# Patient Record
Sex: Female | Born: 1960 | Race: White | Hispanic: No | Marital: Married | Smoking: Never smoker
Health system: Southern US, Community
[De-identification: ages and names within clinical notes are randomized; demographics above are authoritative.]

## PROBLEM LIST (undated history)

## (undated) DIAGNOSIS — E042 Nontoxic multinodular goiter: Secondary | ICD-10-CM

## (undated) DIAGNOSIS — R9389 Abnormal findings on diagnostic imaging of other specified body structures: Secondary | ICD-10-CM

## (undated) DIAGNOSIS — Z8489 Family history of other specified conditions: Secondary | ICD-10-CM

## (undated) DIAGNOSIS — I10 Essential (primary) hypertension: Secondary | ICD-10-CM

## (undated) HISTORY — DX: Essential (primary) hypertension: I10

## (undated) HISTORY — PX: WISDOM TOOTH EXTRACTION: SHX21

## (undated) HISTORY — PX: TONSILLECTOMY AND ADENOIDECTOMY: SUR1326

## (undated) HISTORY — PX: TOTAL ABDOMINAL HYSTERECTOMY: SHX209

---

## 2007-04-29 ENCOUNTER — Other Ambulatory Visit: Admission: RE | Admit: 2007-04-29 | Discharge: 2007-04-29 | Payer: Self-pay | Admitting: Obstetrics and Gynecology

## 2008-04-30 ENCOUNTER — Other Ambulatory Visit: Admission: RE | Admit: 2008-04-30 | Discharge: 2008-04-30 | Payer: Self-pay | Admitting: Obstetrics and Gynecology

## 2013-03-21 ENCOUNTER — Telehealth: Payer: Self-pay | Admitting: Certified Nurse Midwife

## 2013-03-21 NOTE — Telephone Encounter (Signed)
Medication question from patient about missed birth control pills resulting in her cycle starting. Patient went out of town unexpectedly and forgot her birth control pills. She wants to know when to start her new pack please?

## 2013-03-21 NOTE — Telephone Encounter (Signed)
Patient currently on Trivora. Forgot 5 pills and started a period. Advised to start a new pack this Sunday and use BUM for two weeks. Patient agreeable.  Routing to provider for review, Dr. Farrel Gobble do you agree?

## 2013-05-22 HISTORY — PX: COLONOSCOPY WITH PROPOFOL: SHX5780

## 2013-07-15 ENCOUNTER — Telehealth: Payer: Self-pay | Admitting: Certified Nurse Midwife

## 2013-07-15 ENCOUNTER — Encounter: Payer: Self-pay | Admitting: Certified Nurse Midwife

## 2013-07-15 MED ORDER — LEVONORG-ETH ESTRAD TRIPHASIC PO TABS: 1.0000 | ORAL_TABLET | Freq: Every day | ORAL | Status: DC

## 2013-07-15 NOTE — Telephone Encounter (Signed)
Patient requesting a refill for Trivora. AEX 08/18/13 with [name], CNM.  [facility] [place]

## 2013-07-15 NOTE — Telephone Encounter (Signed)
Last AEX 07/17/12 Last refill 07/17/12 x 1 year Next appt 08/18/13  Will refill until appt.  - Patient notified.

## 2013-07-16 ENCOUNTER — Ambulatory Visit: Payer: Self-pay | Admitting: Certified Nurse Midwife

## 2013-07-17 ENCOUNTER — Ambulatory Visit: Payer: Self-pay | Admitting: Certified Nurse Midwife

## 2013-07-29 ENCOUNTER — Ambulatory Visit: Payer: Self-pay | Admitting: Certified Nurse Midwife

## 2013-08-10 ENCOUNTER — Other Ambulatory Visit: Payer: Self-pay | Admitting: Certified Nurse Midwife

## 2013-08-11 NOTE — Telephone Encounter (Signed)
Patient has AEX 08/18/13 with DL//kn

## 2013-08-18 ENCOUNTER — Ambulatory Visit: Payer: Self-pay | Admitting: Certified Nurse Midwife

## 2013-09-05 ENCOUNTER — Other Ambulatory Visit: Payer: Self-pay | Admitting: Certified Nurse Midwife

## 2013-09-08 ENCOUNTER — Ambulatory Visit: Payer: BC Managed Care – PPO | Admitting: Certified Nurse Midwife

## 2013-09-08 ENCOUNTER — Ambulatory Visit (INDEPENDENT_AMBULATORY_CARE_PROVIDER_SITE_OTHER): Payer: BC Managed Care – PPO | Admitting: Certified Nurse Midwife

## 2013-09-08 ENCOUNTER — Encounter: Payer: Self-pay | Admitting: Certified Nurse Midwife

## 2013-09-08 ENCOUNTER — Ambulatory Visit: Payer: Self-pay | Admitting: Certified Nurse Midwife

## 2013-09-08 VITALS — BP 102/62 | HR 64 | Resp 16 | Ht 61.75 in | Wt 127.0 lb

## 2013-09-08 DIAGNOSIS — Z309 Encounter for contraceptive management, unspecified: Secondary | ICD-10-CM

## 2013-09-08 DIAGNOSIS — Z01419 Encounter for gynecological examination (general) (routine) without abnormal findings: Secondary | ICD-10-CM

## 2013-09-08 DIAGNOSIS — Z0000 Encounter for general adult medical examination without abnormal findings: Secondary | ICD-10-CM

## 2013-09-08 LAB — POCT URINALYSIS DIPSTICK
BILIRUBIN UA: NEGATIVE
Blood, UA: NEGATIVE
GLUCOSE UA: NEGATIVE
KETONES UA: NEGATIVE
LEUKOCYTES UA: NEGATIVE
NITRITE UA: NEGATIVE
PH UA: 5
Protein, UA: NEGATIVE
Urobilinogen, UA: NEGATIVE

## 2013-09-08 LAB — HEMOGLOBIN, FINGERSTICK: HEMOGLOBIN, FINGERSTICK: 14.5 g/dL (ref 12.0–16.0)

## 2013-09-08 MED ORDER — NORETHIN ACE-ETH ESTRAD-FE 1-20 MG-MCG PO TABS: 1.0000 | ORAL_TABLET | Freq: Every day | ORAL | Status: DC

## 2013-09-08 NOTE — Progress Notes (Signed)
53 y.o. G23P2002 Married Caucasian Fe here for annual exam. Periods normal, no issues.Contraception working well. Desires continuance. Spouse lost job due to company being bought out, but has a new opportunity. Stress at home due to this and sister in hospital with questionable heart issue.  "Doing the best we can right now". Sleeping well, no issues. See PCP prn. No other health problems today.  [name] was 09/01/2013.          Sexually active: yes  The current method of family planning is OCP (estrogen/progesterone).    Exercising: yes  walking & yardwork Smoker:  no  Health Maintenance: Pap:  07-17-12 neg HPV HR neg MMG:  1/14 normal Colonoscopy: none BMD:   none TDaP:  2011 Labs: Poct urine-neg, Hgb- 14.5 Self breast exam: done occ   reports that she has never smoked. She does not have any smokeless tobacco history on file. She reports that she drinks about one ounce of alcohol per week. She reports that she does not use illicit drugs.  History reviewed. No pertinent past medical history.  Past Surgical History  Procedure Laterality Date  . Mole removal  1/15    4 removed-negative    Current Outpatient Prescriptions  Medication Sig Dispense Refill  . TRIVORA, 28, tablet TAKE 1 TABLET BY MOUTH EVERY DAY  28 tablet  0   No current facility-administered medications for this visit.    Family History  Problem Relation Age of Onset  . Hypertension Mother   . Cancer Sister     hodgekins  . Lung disease Father     ROS:  Pertinent items are noted in HPI.  Otherwise, a comprehensive ROS was negative.  Exam:   BP 102/62  Pulse 64  Resp 16  Ht 5' 1.75" (1.568 m)  Wt 127 lb (57.607 kg)  BMI 23.43 kg/m2  LMP 09/01/2013 Height: 5' 1.75" (156.8 cm)  Ht Readings from Last 3 Encounters:  09/08/13 5' 1.75" (1.568 m)    General appearance: alert, cooperative and appears stated age Head: Normocephalic, without obvious abnormality, atraumatic Neck: no  adenopathy, supple, symmetrical, trachea midline and thyroid normal to inspection and palpation and non-palpable Lungs: clear to auscultation bilaterally Breasts: normal appearance, no masses or tenderness, No nipple retraction or dimpling, No nipple discharge or bleeding, No axillary or supraclavicular adenopathy Heart: regular rate and rhythm Abdomen: soft, non-tender; no masses,  no organomegaly Extremities: extremities normal, atraumatic, no cyanosis or edema Skin: Skin color, texture, turgor normal. No rashes or lesions Lymph nodes: Cervical, supraclavicular, and axillary nodes normal. No abnormal inguinal nodes palpated Neurologic: Grossly normal   Pelvic: External genitalia:  no lesions              Urethra:  normal appearing urethra with no masses, tenderness or lesions              Bartholin's and Skene's: normal                 Vagina: normal appearing vagina with normal color and discharge, no lesions              Cervix: normal, non tender              Pap taken: no Bimanual Exam:  Uterus:  normal size, contour, position, consistency, mobility, non-tender and mid postion              Adnexa: normal adnexa and no mass, fullness, tenderness  Rectovaginal: Confirms               Anus:  normal sphincter tone, no lesions  A:  Well Woman with normal exam  Contraception works well  Social stress with spouse employment  P:   Reviewed health and wellness pertinent to exam  Discussed need to lower estrogen dose in OCP due to age. Patient agreeable.  Rx Loestrin 1/20 Fe see order, encouraged to use condoms during first month as decrease in estrogen level. Continue to keep menses record. Advise if changes.  Discussed family and friend support during time of stress, and time for self.  Mammogram stressed due pap smear not taken today  counseled on breast self exam, mammography screening, use and side effects of OCP's, adequate intake of calcium and vitamin D, diet and  exercise  return annually or prn  An After Visit Summary was printed and given to the patient.

## 2013-09-08 NOTE — Patient Instructions (Signed)

## 2013-09-17 NOTE — Progress Notes (Signed)
Reviewed personally.  M. [name] Reichen Hutzler, MD.  

## 2014-03-23 ENCOUNTER — Encounter: Payer: Self-pay | Admitting: Certified Nurse Midwife

## 2014-09-10 ENCOUNTER — Ambulatory Visit (INDEPENDENT_AMBULATORY_CARE_PROVIDER_SITE_OTHER): Payer: BLUE CROSS/BLUE SHIELD | Admitting: Certified Nurse Midwife

## 2014-09-10 ENCOUNTER — Encounter: Payer: Self-pay | Admitting: Certified Nurse Midwife

## 2014-09-10 ENCOUNTER — Telehealth: Payer: Self-pay | Admitting: Certified Nurse Midwife

## 2014-09-10 VITALS — BP 110/70 | HR 68 | Resp 16 | Ht 62.25 in | Wt 131.0 lb

## 2014-09-10 DIAGNOSIS — Z0000 Encounter for general adult medical examination without abnormal findings: Secondary | ICD-10-CM | POA: Diagnosis not present

## 2014-09-10 DIAGNOSIS — E049 Nontoxic goiter, unspecified: Secondary | ICD-10-CM | POA: Diagnosis not present

## 2014-09-10 DIAGNOSIS — N951 Menopausal and female climacteric states: Secondary | ICD-10-CM | POA: Diagnosis not present

## 2014-09-10 DIAGNOSIS — Z01419 Encounter for gynecological examination (general) (routine) without abnormal findings: Secondary | ICD-10-CM

## 2014-09-10 DIAGNOSIS — Z124 Encounter for screening for malignant neoplasm of cervix: Secondary | ICD-10-CM | POA: Diagnosis not present

## 2014-09-10 DIAGNOSIS — Z3049 Encounter for surveillance of other contraceptives: Secondary | ICD-10-CM | POA: Diagnosis not present

## 2014-09-10 LAB — POCT URINALYSIS DIPSTICK
Bilirubin, UA: NEGATIVE
Glucose, UA: NEGATIVE
Ketones, UA: NEGATIVE
LEUKOCYTES UA: NEGATIVE
Nitrite, UA: NEGATIVE
PROTEIN UA: NEGATIVE
RBC UA: NEGATIVE
UROBILINOGEN UA: NEGATIVE
pH, UA: 5

## 2014-09-10 LAB — CBC
HCT: 42.4 % (ref 36.0–46.0)
Hemoglobin: 13.9 g/dL (ref 12.0–15.0)
MCH: 30.9 pg (ref 26.0–34.0)
MCHC: 32.8 g/dL (ref 30.0–36.0)
MCV: 94.2 fL (ref 78.0–100.0)
MPV: 10.1 fL (ref 8.6–12.4)
Platelets: 243 10*3/uL (ref 150–400)
RBC: 4.5 MIL/uL (ref 3.87–5.11)
RDW: 13.8 % (ref 11.5–15.5)
WBC: 6.8 10*3/uL (ref 4.0–10.5)

## 2014-09-10 LAB — COMPREHENSIVE METABOLIC PANEL WITH GFR
ALT: 14 U/L (ref 0–35)
AST: 13 U/L (ref 0–37)
Albumin: 4 g/dL (ref 3.5–5.2)
Alkaline Phosphatase: 59 U/L (ref 39–117)
BUN: 14 mg/dL (ref 6–23)
CO2: 25 meq/L (ref 19–32)
Calcium: 9.1 mg/dL (ref 8.4–10.5)
Chloride: 107 meq/L (ref 96–112)
Creat: 0.73 mg/dL (ref 0.50–1.10)
Glucose, Bld: 88 mg/dL (ref 70–99)
Potassium: 4.8 meq/L (ref 3.5–5.3)
Sodium: 138 meq/L (ref 135–145)
Total Bilirubin: 0.4 mg/dL (ref 0.2–1.2)
Total Protein: 6.4 g/dL (ref 6.0–8.3)

## 2014-09-10 LAB — THYROID PANEL WITH TSH
Free Thyroxine Index: 2.4 (ref 1.4–3.8)
T3 Uptake: 26 % (ref 22–35)
T4, Total: 9.4 ug/dL (ref 4.5–12.0)
TSH: 0.56 u[IU]/mL (ref 0.350–4.500)

## 2014-09-10 LAB — LIPID PANEL
CHOL/HDL RATIO: 2.5 ratio
Cholesterol: 124 mg/dL (ref 0–200)
HDL: 50 mg/dL (ref >=46)
LDL CALC: 41 mg/dL (ref 0–99)
TRIGLYCERIDES: 163 mg/dL — AB (ref <150)
VLDL: 33 mg/dL (ref 0–40)

## 2014-09-10 NOTE — Telephone Encounter (Signed)
Due to enlargement even if results are normal will need [place] to rule out nodules that may be present and not felt.

## 2014-09-10 NOTE — Telephone Encounter (Signed)
Spoke with patient. Patient calling to check to see if she needs to schedule thyroid ultrasound before receiving results from thyroid testing. Advised per OV note Regina Eck CNM recommends evaluation with lab work and [place]. Patient prefers to wait until lab results back to see if [place] is needed. Advised she will be contacted with results and if [place] is recommended at that time will help to facilitate scheduling. Patient is agreeable.  Routing to provider for final review. Patient agreeable to disposition. Will close encounter

## 2014-09-10 NOTE — Progress Notes (Signed)
54 y.o. G77P2002 Married  Caucasian Fe here for annual exam. Periods still regular. Continues to take OCP aware she will need discontinue. Occasional hot flash, no issues. Patient only see Urgent care if needed. No health issues in the last year. No other problems today.  [name] was 08/30/2014.          Sexually active: Yes.    The current method of family planning is OCP (estrogen/progesterone).    Exercising: Yes.    walking Smoker:  no  Health Maintenance: Pap:  07-17-12 neg HPV HR neg MMG:  09-23-13 category c,birads 1:neg, needs 3 D yearly Colonoscopy:  5/15 neg. 10 years BMD:   none TDaP:  2011 Labs: Poct urine-neg,Hgb-13.6 Self breast exam: done occ   reports that she has never smoked. She does not have any smokeless tobacco history on file. She reports that she drinks about 1.2 - 1.8 oz of alcohol per week. She reports that she does not use illicit drugs.  History reviewed. No pertinent past medical history.  Past Surgical History  Procedure Laterality Date  . Mole removal  1/15    4 removed-negative    Current Outpatient Prescriptions  Medication Sig Dispense Refill  . norethindrone-ethinyl estradiol (JUNEL FE,GILDESS FE,LOESTRIN FE) 1-20 MG-MCG tablet Take 1 tablet by mouth daily. 1 Package 12   No current facility-administered medications for this visit.    Family History  Problem Relation Age of Onset  . Hypertension Mother   . Cancer Sister     hodgekins  . Lung disease Father     ROS:  Pertinent items are noted in HPI.  Otherwise, a comprehensive ROS was negative.  Exam:   BP 110/70 mmHg  Pulse 68  Resp 16  Ht 5' 2.25" (1.581 m)  Wt 131 lb (59.421 kg)  BMI 23.77 kg/m2  LMP 08/30/2014 Height: 5' 2.25" (158.1 cm) Ht Readings from Last 3 Encounters:  09/10/14 5' 2.25" (1.581 m)  09/08/13 5' 1.75" (1.568 m)    General appearance: alert, cooperative and appears stated age Head: Normocephalic, without obvious abnormality,  atraumatic Neck: no adenopathy, supple, symmetrical, trachea midline and thyroid enlarged and on right, no nodule palpated Lungs: clear to auscultation bilaterally Breasts: normal appearance, no masses or tenderness, No nipple retraction or dimpling, No nipple discharge or bleeding, No axillary or supraclavicular adenopathy Heart: regular rate and rhythm Abdomen: soft, non-tender; no masses,  no organomegaly Extremities: extremities normal, atraumatic, no cyanosis or edema Skin: Skin color, texture, turgor normal. No rashes or lesions Lymph nodes: Cervical, supraclavicular, and axillary nodes normal. No abnormal inguinal nodes palpated Neurologic: Grossly normal   Pelvic: External genitalia:  no lesions              Urethra:  normal appearing urethra with no masses, tenderness or lesions              Bartholin's and Skene's: normal                 Vagina: normal appearing vagina with normal color and discharge, no lesions              Cervix: normal, non tender, no lesions              Pap taken: Yes.   Bimanual Exam:  Uterus:  normal size, contour, position, consistency, mobility, non-tender              Adnexa: normal adnexa and no mass, fullness, tenderness  Rectovaginal: Confirms               Anus:  normal sphincter tone, no lesions    A:  Well Woman with normal exam  ?perimenopausal  Contraception OCP, aware she will need to discontinue this year  Enlarged thyroid  Screening labs  P:   Reviewed health and wellness pertinent to exam  Discussed etiology of perimenopause and expectations with cycles and symptoms. Patient to come in on day 6 of placebo of OCP to have [place], [place] drawn. Will  Not start back on OCP. Will discuss management when lab in.  Discussed finding of enlarged thyroid and need to evaluate with [place]. Patient agreeable. Will schedule with imaging and patient will be called with appointment. Questions addressed regarding possible etiology and lab work  also needed.  Labs: TSH with panel, lipid panel, CMP,Vit. D, CBC  Pap smear taken today with HPV relflex   counseled on breast self exam, mammography screening, use and side effects of OCP's, menopause, adequate intake of calcium and vitamin D, diet and exercise  return annually or prn  An After Visit Summary was printed and given to the patient.

## 2014-09-10 NOTE — Patient Instructions (Signed)

## 2014-09-10 NOTE — Telephone Encounter (Signed)
Pt left voicemail. Pt stated she saw D.Leonard this morning and would like her nurse to call her back. Pt stated she has some questions.

## 2014-09-10 NOTE — Progress Notes (Signed)
Reviewed personally.  M. [name] Amando Ishikawa, MD.  

## 2014-09-11 LAB — VITAMIN D 25 HYDROXY (VIT D DEFICIENCY, FRACTURES): Vit D, 25-Hydroxy: 44 ng/mL (ref 30–100)

## 2014-09-11 NOTE — Telephone Encounter (Signed)
Left message to call Kaleigha Chamberlin at [phone]. 

## 2014-09-11 NOTE — Telephone Encounter (Addendum)
Spoke with patient. Advised of message as seen below form Regina Eck CNM. Patient is agreeable and would like to schedule [place] at this time. Spoke with Angelita Ingles at [facility]. Thyroid US scheduled for 4/26 at 11am at [place]. Spoke with patient. Patient is agreeable of appointment date and time. Patient requesting results from labs yesterday. Advised will have provider review and return call with further recommendations. Patient is agreeable.  Routing to [employer], FNP as Regina Eck CNM is out of the office today.

## 2014-09-11 NOTE — Telephone Encounter (Signed)
Spoke with patient. Patient calling in regards to lab work results. Advised patient thyroid panel, CMP, Vit D, and CBC are all normal. Advised patient lipid panel is normal minus slight elevation in Triglycerides at 163. Advised to increase exercise and healthy eating to decrease this level. Patient is agreeable. Advised will have provider review results as well and if anything further is needed will return call. Patient is agreeable.

## 2014-09-14 LAB — HEMOGLOBIN, FINGERSTICK: Hemoglobin, fingerstick: 13.6 g/dL (ref 12.0–16.0)

## 2014-09-14 LAB — IPS PAP TEST WITH REFLEX TO HPV

## 2014-09-15 ENCOUNTER — Telehealth: Payer: Self-pay | Admitting: Certified Nurse Midwife

## 2014-09-15 ENCOUNTER — Ambulatory Visit
Admission: RE | Admit: 2014-09-15 | Discharge: 2014-09-15 | Disposition: A | Discharge status: Home / Self Care | Payer: BLUE CROSS/BLUE SHIELD | Source: Ambulatory Visit | Attending: Certified Nurse Midwife | Admitting: Certified Nurse Midwife

## 2014-09-15 DIAGNOSIS — E049 Nontoxic goiter, unspecified: Secondary | ICD-10-CM

## 2014-09-15 NOTE — Telephone Encounter (Signed)
Routing to Regina Eck CNM as Juluis Rainier. Result report not yet available in EPIC.

## 2014-09-15 NOTE — Telephone Encounter (Signed)
Patient calling to let Verline Lema know that she had her thyroid ultrasound today. Patient was told her results would be faxed today. Patient will wait for a call when results are in.

## 2014-09-16 ENCOUNTER — Telehealth: Payer: Self-pay | Admitting: Certified Nurse Midwife

## 2014-09-16 DIAGNOSIS — E049 Nontoxic goiter, unspecified: Secondary | ICD-10-CM

## 2014-09-16 NOTE — Telephone Encounter (Signed)
Patient calling to get her ultrasound results.

## 2014-09-16 NOTE — Telephone Encounter (Signed)
   Result Notes     Notes Recorded by Regina Eck, CNM on 09/16/2014 at 8:17 AM Notify patient that thyroid US showed enlarged right lobe with complex nodule noted Left is enlarged with multiple nodules with solid nodule noted Thyroid biopsy is recommended to evaluate.  Please refer to Endocrine with an appointment soon for evaluation due biopsy recommendation.

## 2014-09-16 NOTE — Telephone Encounter (Signed)
Left message at [facility] to set up new patient appointment with Dr.Balan or first available provider. Requested return call to schedule.

## 2014-09-16 NOTE — Telephone Encounter (Signed)
Spoke with patient. Advised of results and message as seen below from [place]. Patient is agreeable. Advised I have placed a referral to [facility] for further evaluation. Patient is agreeable and would like first availabel appointment with their office. Advised will call to schedule appointment and return call with appointment date and time.

## 2014-09-17 NOTE — Telephone Encounter (Signed)
Left message to call Rocio Roam at [phone]. 

## 2014-09-17 NOTE — Telephone Encounter (Signed)
Spoke with patient. Patient states that she spoke with Dr.Balan's office and the first available appointment they have is in June. "I am worried because this is so far away and I will have to wait that long to have the biopsy." Advised patient will refer her to Dr.Althiemer to see if we can not get her an earlier appointment. Patient is agreeable.

## 2014-09-17 NOTE — Telephone Encounter (Signed)
Patient requests a call back from [name], CNM. She says she has a few questions for her.

## 2014-09-18 NOTE — Telephone Encounter (Signed)
Left message to call [place] at [phone]. Appointment scheduled at [facility] with [place] on May 5th at 2:45pm.

## 2014-09-18 NOTE — Telephone Encounter (Signed)
Spoke with Amy at [place] office referral faxed with demographics, insurance information, OV notes, and thyroid ultrasound results with cover sheet to their office for Dr.Althiemer's review.

## 2014-09-18 NOTE — Telephone Encounter (Signed)
Patient calling to check on the status of the request below. She requests a call back today "no matter what."

## 2014-09-21 NOTE — Telephone Encounter (Signed)
Spoke with patient on 4/29. Advised of appointment date and time as seen below for May 5th at 2:45pm with Dr.Ellison at [facility]. Patient was agreeable to date and time.  Routing to provider for final review. Patient agreeable to disposition. Will close encounter

## 2014-09-25 ENCOUNTER — Other Ambulatory Visit (HOSPITAL_COMMUNITY)
Admission: RE | Admit: 2014-09-25 | Discharge: 2014-09-25 | Disposition: A | Discharge status: Home / Self Care | Payer: BLUE CROSS/BLUE SHIELD | Source: Ambulatory Visit | Attending: Endocrinology | Admitting: Endocrinology

## 2014-09-25 ENCOUNTER — Encounter: Payer: Self-pay | Admitting: Endocrinology

## 2014-09-25 ENCOUNTER — Ambulatory Visit (INDEPENDENT_AMBULATORY_CARE_PROVIDER_SITE_OTHER): Payer: BLUE CROSS/BLUE SHIELD | Admitting: Endocrinology

## 2014-09-25 VITALS — BP 132/82 | HR 95 | Temp 98.2°F | Ht 62.25 in | Wt 131.0 lb

## 2014-09-25 DIAGNOSIS — E042 Nontoxic multinodular goiter: Secondary | ICD-10-CM

## 2014-09-25 DIAGNOSIS — E041 Nontoxic single thyroid nodule: Secondary | ICD-10-CM | POA: Diagnosis not present

## 2014-09-25 NOTE — Progress Notes (Signed)
   Subjective:    Patient ID: Emily Montgomery, female    DOB: Sep 27, 1960, 54 y.o.   MRN: [id number]  HPI Pt was incidentally noted to have a slight nodule at the thyroid in early 2016.  She has never had thyroid probs before.  she has no h/o XRT or surgery to the neck.  No assoc pain. No past medical history on file.  Past Surgical History  Procedure Laterality Date  . Mole removal  1/15    4 removed-negative    History   Social History  . Marital Status: Married    Spouse Name: N/A  . Number of Children: N/A  . Years of Education: N/A   Occupational History  . Not on file.   Social History Main Topics  . Smoking status: Never Smoker   . Smokeless tobacco: Not on file  . Alcohol Use: 1.2 - 1.8 oz/week    2-3 Standard drinks or equivalent per week  . Drug Use: No  . Sexual Activity:    Partners: Male    Birth Control/ Protection: Pill   Other Topics Concern  . Not on file   Social History Narrative    Current Outpatient Prescriptions on File Prior to Visit  Medication Sig Dispense Refill  . norethindrone-ethinyl estradiol (JUNEL FE,GILDESS FE,LOESTRIN FE) 1-20 MG-MCG tablet Take 1 tablet by mouth daily. 1 Package 12   No current facility-administered medications on file prior to visit.    No Known Allergies  Family History  Problem Relation Age of Onset  . Hypertension Mother   . Cancer Sister     hodgekins  . Lung disease Father   . Thyroid disease Neg Hx     BP 132/82 mmHg  Pulse 95  Temp(Src) 98.2 F (36.8 C) (Oral)  Ht 5' 2.25" (1.581 m)  Wt 131 lb (59.421 kg)  BMI 23.77 kg/m2  SpO2 95%  LMP 08/30/2014     Review of Systems Denies dysphagia.    Objective:   Physical Exam VITAL SIGNS:  See vs page GENERAL: no distress Neck: 3 cm right nodule is easily palpable Skin: not diaphoretic Neuro: no tremor  Radiol: i reviewed thyroid US report  Lab Results  Component Value Date   TSH 0.560 09/10/2014   T4TOTAL 9.4 09/10/2014    thyroid needle bx: consent obtained, signed form on chart The area is first sprayed with cooling agent local: xylocaine 2%, with epinephrine prep: alcohol pad 2 bxs are done with 25 and 27g needles.  4 cc brown fluid is aspirated no complications    Assessment & Plan:  Thyroid cyst, new  Patient is advised the following: Patient Instructions  We'll let you know about the results. If no cancer is found, please return in 1 year. The fluid will reacumulate soon.   most of the time, a "lumpy thyroid" will eventually become overactive.  this is usually a slow process, happening over the span of many years.

## 2014-09-25 NOTE — Patient Instructions (Signed)
We'll let you know about the results. If no cancer is found, please return in 1 year. The fluid will reacumulate soon.   most of the time, a "lumpy thyroid" will eventually become overactive.  this is usually a slow process, happening over the span of many years.

## 2014-09-30 ENCOUNTER — Encounter: Payer: Self-pay | Admitting: Endocrinology

## 2014-10-01 ENCOUNTER — Encounter: Payer: Self-pay | Admitting: Endocrinology

## 2014-10-01 ENCOUNTER — Other Ambulatory Visit: Payer: Self-pay | Admitting: Certified Nurse Midwife

## 2014-10-01 NOTE — Telephone Encounter (Signed)
She needs to come in for [facility] and AMH so we could see if she still needed contraception. No refill order already in.

## 2014-10-01 NOTE — Telephone Encounter (Signed)
Medication refill request:  Last AEX:  09/10/14 DL Next AEX: 09/17/15 DL Last MMG (if hormonal medication request): 09/23/13 [id number] Refill authorized: 09/08/13 #1pack 12 refills. Today declined? Pt supposed to stop OCP this year?

## 2014-10-02 ENCOUNTER — Telehealth: Payer: Self-pay | Admitting: Emergency Medicine

## 2014-10-02 ENCOUNTER — Other Ambulatory Visit (INDEPENDENT_AMBULATORY_CARE_PROVIDER_SITE_OTHER): Payer: BLUE CROSS/BLUE SHIELD

## 2014-10-02 DIAGNOSIS — N951 Menopausal and female climacteric states: Secondary | ICD-10-CM

## 2014-10-02 NOTE — Telephone Encounter (Signed)
Patient came to triage office after lab draw today. She is on placebo pills today for lab draw. She is to start a new pack of pills this Sunday and wondering if new pack of pills needs to be ordered. Advised patient will need to wait until results are received in order to restart pills per Regina Eck CNM note. Patient states she is still having cycle and VERY concerned about not getting pregnant. Advised patient to use back up method until she is contacted and if needs to continue with ocp based on results orders will be placed by Regina Eck CNM.  Patient agreeable.

## 2014-10-03 LAB — FOLLICLE STIMULATING HORMONE: FSH: 59.8 m[IU]/mL

## 2014-10-05 NOTE — Telephone Encounter (Signed)
Waiting on [facility]H to come in once in can decide

## 2014-10-05 NOTE — Telephone Encounter (Signed)
Patient came for lab work 10/02/14. Would you refill Rx?

## 2014-10-05 NOTE — Telephone Encounter (Signed)
Patient called supposed to start back pills Sunday.  She asks if she can have 1 month of her pills until results come back?  Mrs Emily Montgomery please advise

## 2014-10-06 NOTE — Telephone Encounter (Signed)
Will not refill this Rx due to menopausal range and will advise of [facility] when in. Condom use and if patient has symptoms of menopausal can discuss HRT if indicated.

## 2014-10-06 NOTE — Telephone Encounter (Signed)
Patient notified of advise from Mrs Jackelyn Poling below. Patient verbalized understanding. She states she will use condoms and will wait for the results of AMH.

## 2014-10-07 LAB — ANTI MULLERIAN HORMONE: AMH AssessR: <0.03 ng/mL

## 2014-12-04 ENCOUNTER — Telehealth: Payer: Self-pay | Admitting: Certified Nurse Midwife

## 2014-12-04 NOTE — Telephone Encounter (Signed)
Spoke with patient. Patient states that her LMP was in May. Patient started cycle again today. Received results that she was in menopause. Patient is concerned. Advised this is not uncommon and that she may have months were she does still have a cycle. Advised once she has gone 12 months without a cycle if she has any bleeding very important to call. Advised once she has gone 12 months with no cycle is considered to be in menopause. Patient is agreeable. Patient is concerned regarding pregnancy as she was advised to stop her OCP. Patient's FSH was 59.8 and AMH was < 0.03 on 10/02/2014. Advised AMH shows no ovulatory reserve which indicates patient can not get pregnant. Patient is still concerned with having cycle. Advised I will speak with covering provider and return call with any further recommendations. Patient is agreeable.  Routing to [employer], FNP for any further recommendations.

## 2014-12-04 NOTE — Telephone Encounter (Signed)
Patient's cycle started today and she would like to speak with nurse since she didn't think it would be starting again. Chart to triage.

## 2014-12-07 NOTE — Telephone Encounter (Signed)
No further recommendations other than monitoring and call if prolonged bleeding.

## 2014-12-07 NOTE — Telephone Encounter (Signed)
Left message to call Donnalyn Juran at [phone]. 

## 2014-12-11 NOTE — Telephone Encounter (Signed)
Spoke with patient. Advised of message as seen below from [place] Cage, [place]. Patient is agreeable and verbalizes understanding.  Routing to provider for final review. Patient agreeable to disposition. Will close encounter.

## 2015-09-04 IMAGING — US US SOFT TISSUE HEAD/NECK
1 series · 14 of 25 positions shown · non-contrast
Comparison: None.

CLINICAL DATA: Enlarged thyroid gland.

EXAM:
THYROID ULTRASOUND
TECHNIQUE: Ultrasound examination of the thyroid gland and adjacent soft
tissues was performed.

[Series 1: us soft tissue head/neck · 0.09mm/px · 14 of 69 slices shown]
[im 1/69]
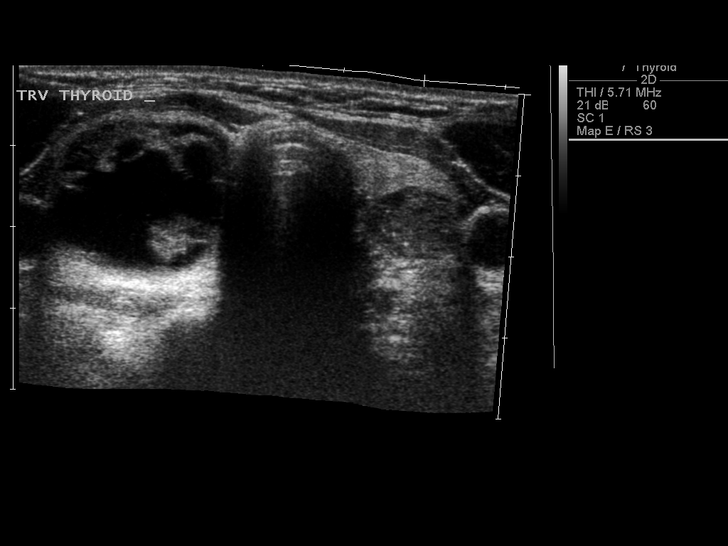
[im 6/69]
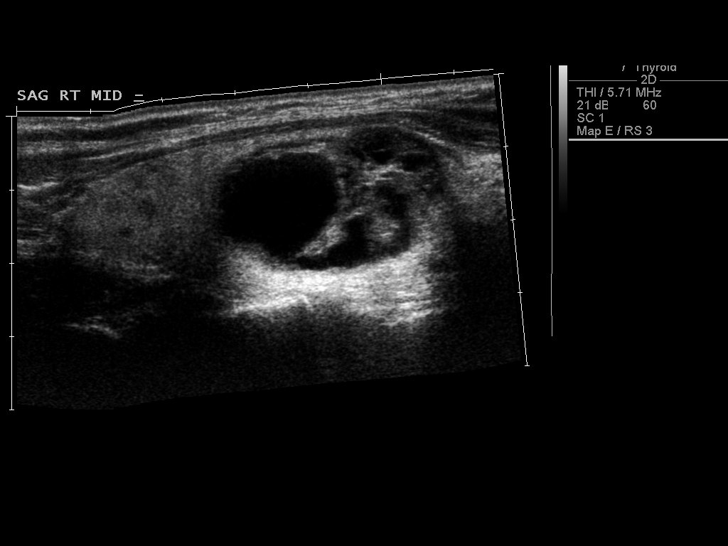
[im 12/69]
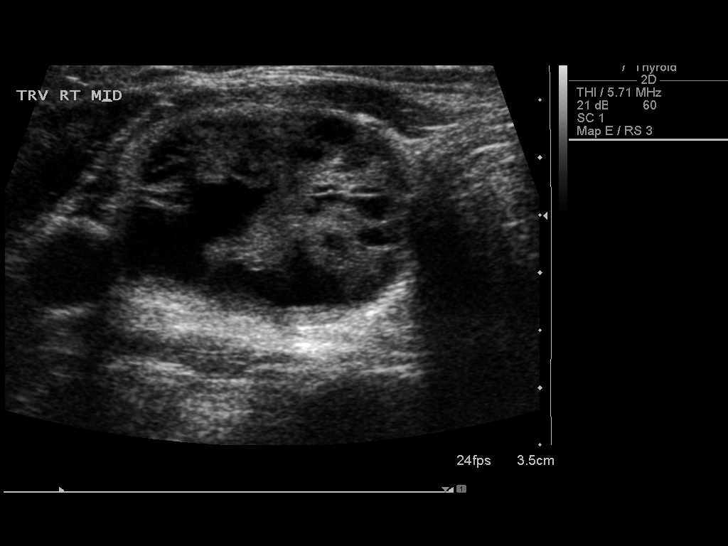
[im 18/69]
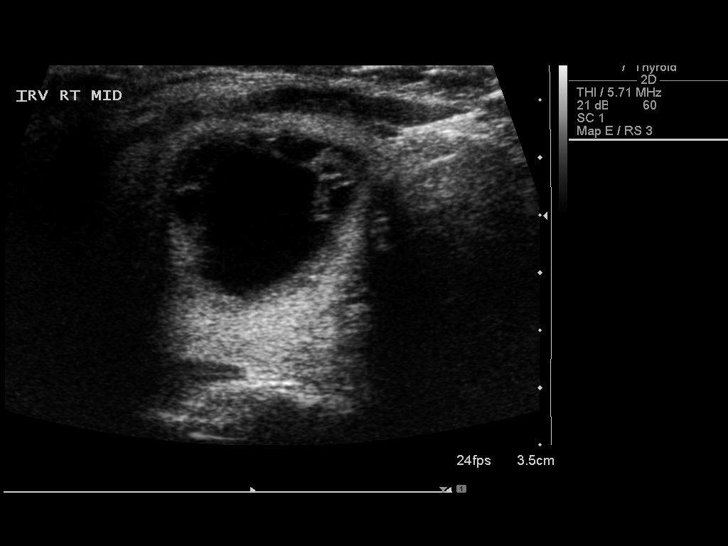
[im 23/69]
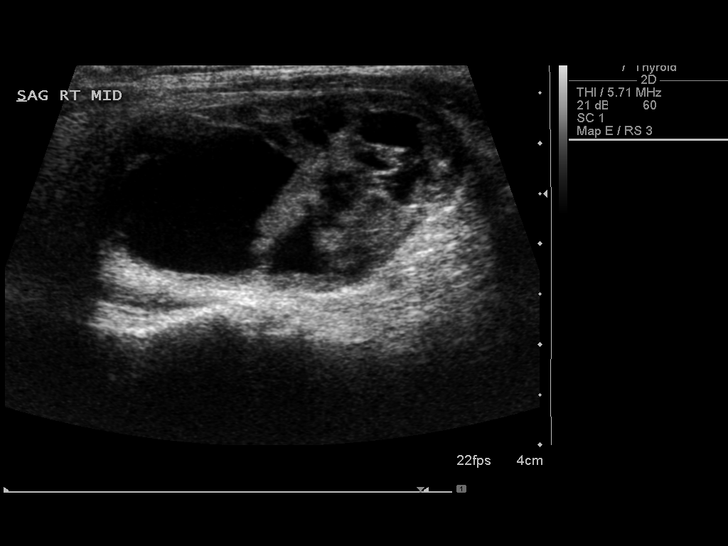
[im 26/69]
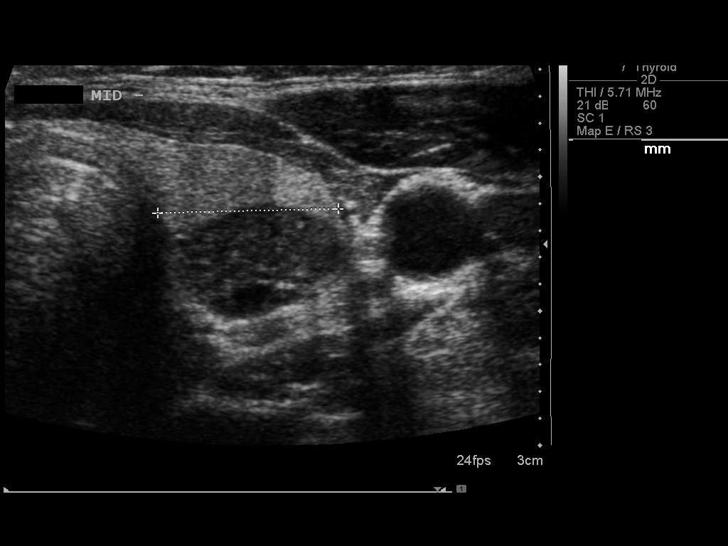
[im 32/69]
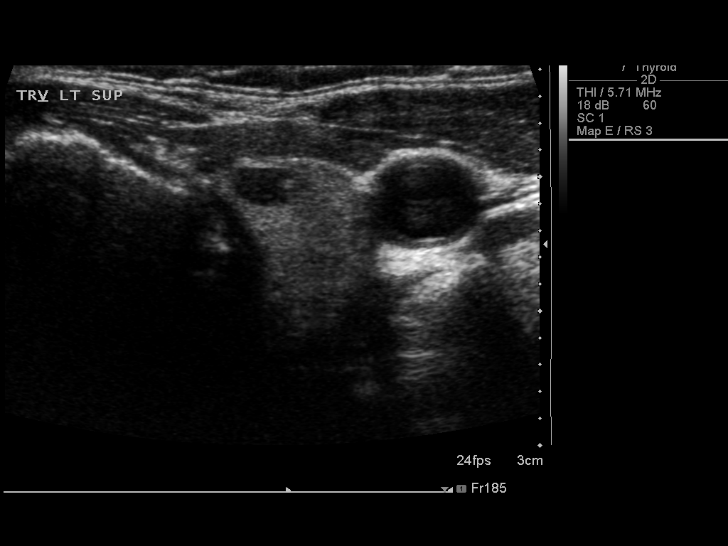
[im 37/69]
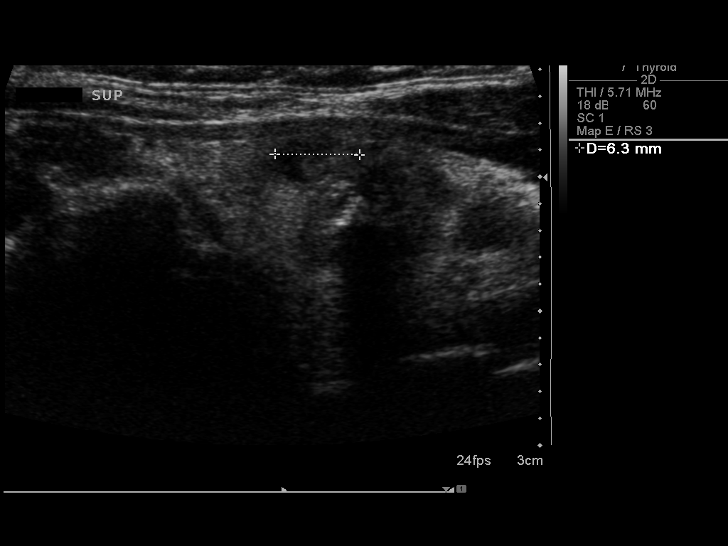
[im 43/69]
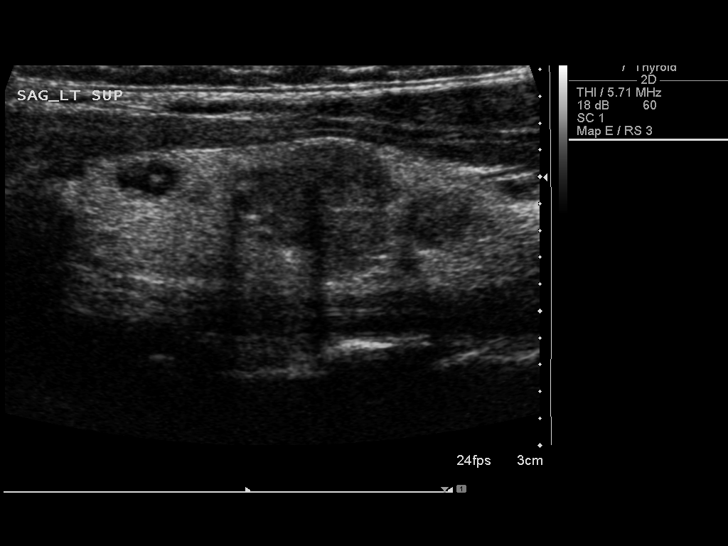
[im 46/69]
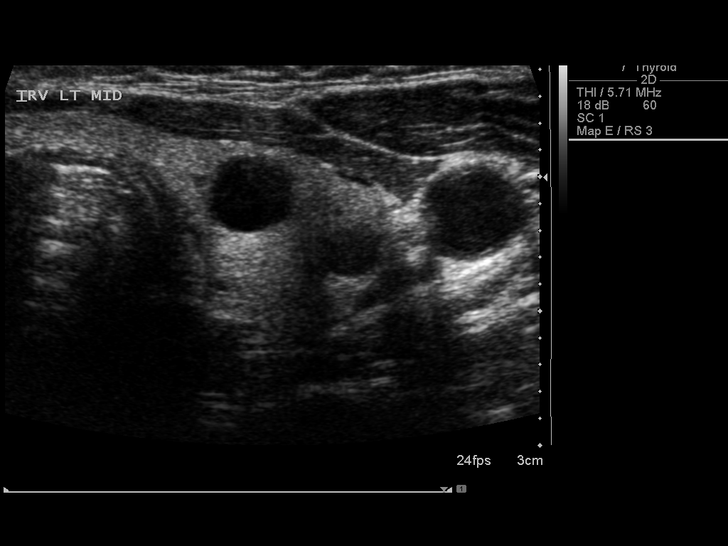
[im 52/69]
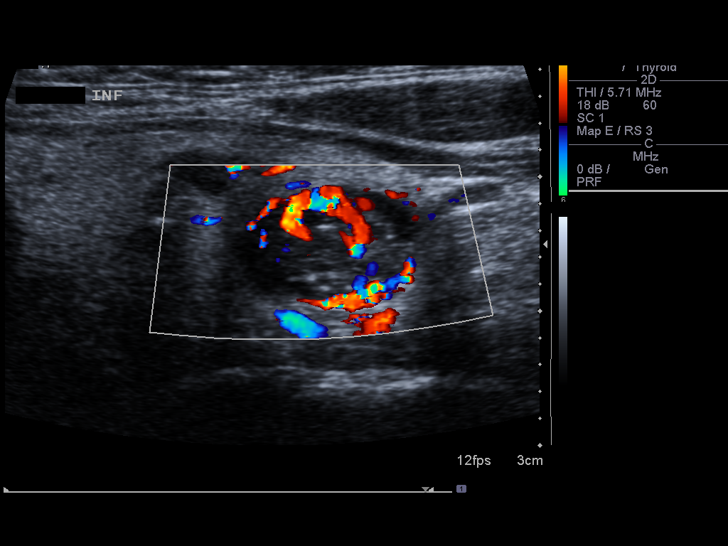
[im 57/69]
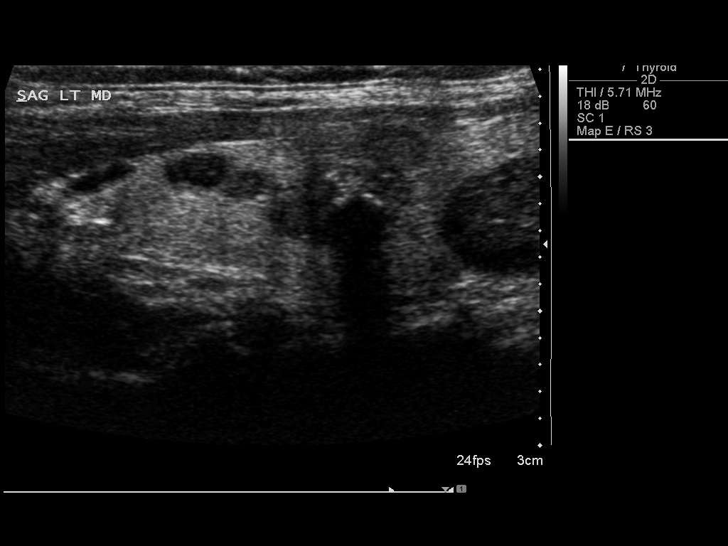
[im 63/69]
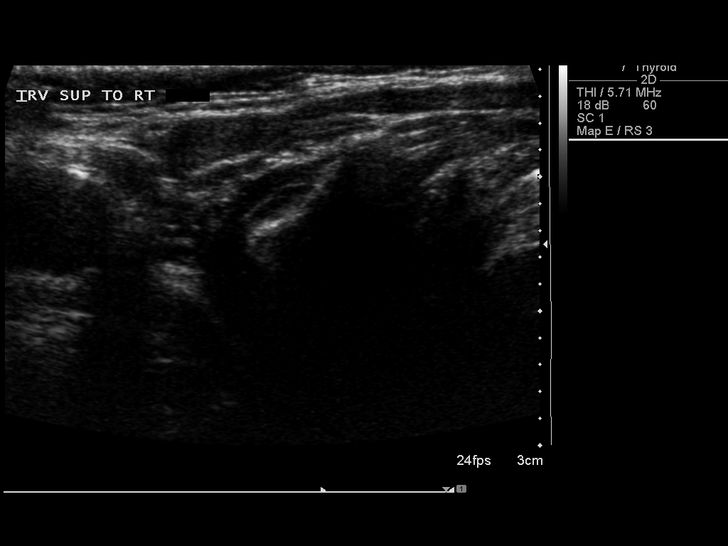
[im 69/69]
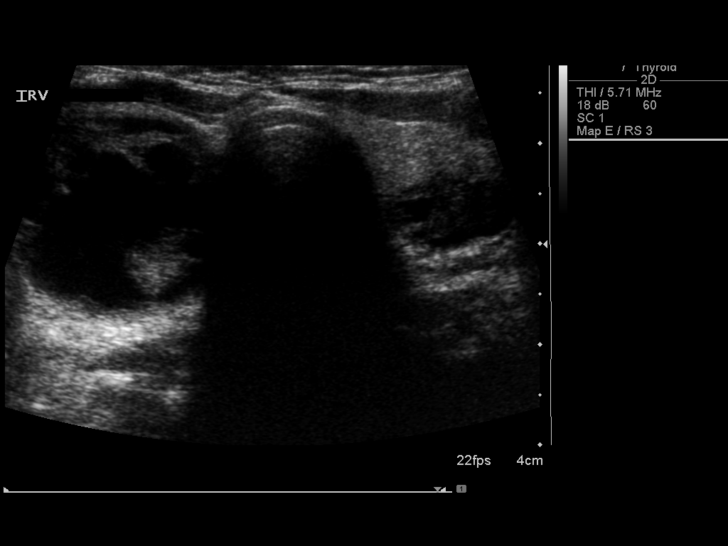

[14 of 25 positions shown; findings below may reference images not displayed]

FINDINGS: Right thyroid lobe

Measurements: 5.8 x 2.4 x 2.3 cm. Complex nodule with significant
solid components is noted in the mid and lower pole measuring 3.7 x
2.6 x 1.9 cm.

Left thyroid lobe

Measurements: 4.7 x 1.7 x 1.4 cm. Multiple nodules are noted, with
the largest measuring 1.4 x 1.4 x 0.9 cm in the lower pole, which is
predominantly solid.

Isthmus

Thickness: 2 mm.  No nodules visualized.

Lymphadenopathy

None visualized.
IMPRESSION: Multiple nodules are noted bilaterally, with the largest being
complex nodule in right lobe measuring 3.7 x 2.6 x 1.9 cm. Findings
meet consensus criteria for biopsy. Ultrasound-guided fine needle
aspiration should be considered, as per the consensus statement:
Management of Thyroid Nodules Detected at US: Society of
Radiologists in Ultrasound Consensus Conference Statement. Radiology

## 2015-09-15 ENCOUNTER — Encounter: Payer: Self-pay | Admitting: Certified Nurse Midwife

## 2015-09-15 ENCOUNTER — Ambulatory Visit (INDEPENDENT_AMBULATORY_CARE_PROVIDER_SITE_OTHER): Payer: BLUE CROSS/BLUE SHIELD | Admitting: Certified Nurse Midwife

## 2015-09-15 VITALS — BP 118/78 | HR 70 | Resp 16 | Ht 62.25 in | Wt 127.0 lb

## 2015-09-15 DIAGNOSIS — Z01419 Encounter for gynecological examination (general) (routine) without abnormal findings: Secondary | ICD-10-CM

## 2015-09-15 DIAGNOSIS — Z0000 Encounter for general adult medical examination without abnormal findings: Secondary | ICD-10-CM

## 2015-09-15 LAB — LIPID PANEL
CHOL/HDL RATIO: 2.5 ratio (ref <=5.0)
CHOLESTEROL: 163 mg/dL (ref 125–200)
HDL: 65 mg/dL (ref >=46)
LDL CALC: 82 mg/dL (ref <130)
Triglycerides: 81 mg/dL (ref <150)
VLDL: 16 mg/dL (ref <30)

## 2015-09-15 LAB — CBC
HCT: 43.8 % (ref 35.0–45.0)
Hemoglobin: 14.8 g/dL (ref 11.7–15.5)
MCH: 31.4 pg (ref 27.0–33.0)
MCHC: 33.8 g/dL (ref 32.0–36.0)
MCV: 92.8 fL (ref 80.0–100.0)
MPV: 10.2 fL (ref 7.5–12.5)
PLATELETS: 252 10*3/uL (ref 140–400)
RBC: 4.72 MIL/uL (ref 3.80–5.10)
RDW: 13.6 % (ref 11.0–15.0)
WBC: 6.1 10*3/uL (ref 3.8–10.8)

## 2015-09-15 NOTE — Patient Instructions (Signed)

## 2015-09-15 NOTE — Progress Notes (Signed)
55 y.o. G44P2002 Married  Caucasian Fe here for annual exam.  Periods every 3 months with light to spotting for 2-5 days. Some hot flashes and occasional night sweats.  Sees Dr. Loanne Drilling for thyroid nodule  management. Stable at present. Screening labs today. Sees Urgent care if needed. No other health issues today.  [name] was 06/23/2015.          Sexually active: Yes.    The current method of family planning is condoms.    Exercising: Yes.    twice weekly Smoker:  no  Health Maintenance: Pap:  09-10-14 neg MMG:  09-19-13 category c density, birads 1:neg, appt next month 10/20/15 Colonoscopy: 5/15 neg f/u 83yrs BMD:   none TDaP:  2011 Shingles: no Pneumonia: no Hep C and HIV: during pregnancy, for life insurance Labs: none Self breast exam: done occ   reports that she has never smoked. She does not have any smokeless tobacco history on file. She reports that she drinks about 1.2 - 2.4 oz of alcohol per week. She reports that she does not use illicit drugs.  History reviewed. No pertinent past medical history.  Past Surgical History  Procedure Laterality Date  . Mole removal  1/15    4 removed-negative    Current Outpatient Prescriptions  Medication Sig Dispense Refill  . ibuprofen (ADVIL,MOTRIN) 200 MG tablet Take 200 mg by mouth as needed.     No current facility-administered medications for this visit.    Family History  Problem Relation Age of Onset  . Hypertension Mother   . Cancer Sister     hodgekins  . Lung disease Father   . Thyroid disease Neg Hx   . Thyroid cancer Paternal Grandmother     ROS:  Pertinent items are noted in HPI.  Otherwise, a comprehensive ROS was negative.  Exam:   BP 118/78 mmHg  Pulse 70  Resp 16  Ht 5' 2.25" (1.581 m)  Wt 127 lb (57.607 kg)  BMI 23.05 kg/m2  LMP 06/23/2015 Height: 5' 2.25" (158.1 cm) Ht Readings from Last 3 Encounters:  09/15/15 5' 2.25" (1.581 m)  09/25/14 5' 2.25" (1.581 m)  09/10/14 5'  2.25" (1.581 m)    General appearance: alert, cooperative and appears stated age Head: Normocephalic, without obvious abnormality, atraumatic Neck: no adenopathy, supple, symmetrical, trachea midline and thyroid normal to inspection and palpation Lungs: clear to auscultation bilaterally Breasts: normal appearance, no masses or tenderness, No nipple retraction or dimpling, No nipple discharge or bleeding, No axillary or supraclavicular adenopathy Heart: regular rate and rhythm Abdomen: soft, non-tender; no masses,  no organomegaly Extremities: extremities normal, atraumatic, no cyanosis or edema Skin: Skin color, texture, turgor normal. No rashes or lesions Lymph nodes: Cervical, supraclavicular, and axillary nodes normal. No abnormal inguinal nodes palpated Neurologic: Grossly normal   Pelvic: External genitalia:  no lesions              Urethra:  normal appearing urethra with no masses, tenderness or lesions              Bartholin's and Skene's: normal                 Vagina: normal appearing vagina with normal color and discharge, no lesions              Cervix: no cervical motion tenderness, no lesions and normal appearance              Pap taken: No. Bimanual Exam:  Uterus:  normal size, contour, position, consistency, mobility, non-tender              Adnexa: normal adnexa and no mass, fullness, tenderness               Rectovaginal: Confirms               Anus:  normal sphincter tone, no lesions  Chaperone present: yes  A:  Well Woman with normal exam  Contraception condoms  Perimenopausal with cycle changes  Enlarged thyroid with right nodule  Screening labs    P:   Reviewed health and wellness pertinent to exam  Discussed calling if no period by June and will need evaluation and ? Provera challenge. Aware of perimenopausal etiology and expectations.  Continue follow up with MD as indicated  Labs:CBC, Lipid panel, Vitamin D  Pap smear as above not taken   counseled  on breast self exam, mammography screening, menopause, adequate intake of calcium and vitamin D, diet and exercise  return annually or prn  An After Visit Summary was printed and given to the patient.

## 2015-09-16 LAB — VITAMIN D 25 HYDROXY (VIT D DEFICIENCY, FRACTURES): Vit D, 25-Hydroxy: 44 ng/mL (ref 30–100)

## 2015-09-17 ENCOUNTER — Ambulatory Visit: Payer: BLUE CROSS/BLUE SHIELD | Admitting: Certified Nurse Midwife

## 2015-09-17 NOTE — Progress Notes (Signed)
Encounter reviewed Jill Jertson, MD   

## 2015-09-24 ENCOUNTER — Encounter: Payer: Self-pay | Admitting: Endocrinology

## 2015-09-24 ENCOUNTER — Ambulatory Visit (INDEPENDENT_AMBULATORY_CARE_PROVIDER_SITE_OTHER): Payer: BLUE CROSS/BLUE SHIELD | Admitting: Endocrinology

## 2015-09-24 VITALS — BP 124/72 | HR 70 | Temp 98.3°F | Ht 62.5 in | Wt 128.0 lb

## 2015-09-24 DIAGNOSIS — E042 Nontoxic multinodular goiter: Secondary | ICD-10-CM

## 2015-09-24 LAB — TSH: TSH: 0.44 u[IU]/mL (ref 0.35–4.50)

## 2015-09-24 NOTE — Progress Notes (Signed)
Subjective:    Patient ID: Emily Montgomery, female    DOB: June 28, 1960, 55 y.o.   MRN: [id number]  HPI Pt returns for f/u of thyroid nodule (dx'ed early 2016; bx in 2016 showed CYSTIC CONTENTS WITH EXTREMELY RARE FOLLICULAR CELLS, A NONDIAGNOSTIC PICTURE (BETHESDA CATEGORY I)). She does not notice the nodule.  No past medical history on file.  Past Surgical History  Procedure Laterality Date  . Mole removal  1/15    4 removed-negative    Social History   Social History  . Marital Status: Married    Spouse Name: N/A  . Number of Children: N/A  . Years of Education: N/A   Occupational History  . Not on file.   Social History Main Topics  . Smoking status: Never Smoker   . Smokeless tobacco: Not on file  . Alcohol Use: 1.2 - 2.4 oz/week    2-4 Standard drinks or equivalent per week  . Drug Use: No  . Sexual Activity:    Partners: Male    Birth Control/ Protection: Condom   Other Topics Concern  . Not on file   Social History Narrative    Current Outpatient Prescriptions on File Prior to Visit  Medication Sig Dispense Refill  . ibuprofen (ADVIL,MOTRIN) 200 MG tablet Take 200 mg by mouth as needed.     No current facility-administered medications on file prior to visit.    No Known Allergies  Family History  Problem Relation Age of Onset  . Hypertension Mother   . Cancer Sister     hodgekins  . Lung disease Father   . Thyroid disease Neg Hx   . Thyroid cancer Paternal Grandmother     BP 124/72 mmHg  Pulse 70  Temp(Src) 98.3 F (36.8 C) (Oral)  Ht 5' 2.5" (1.588 m)  Wt 128 lb (58.06 kg)  BMI 23.02 kg/m2  SpO2 94%  LMP 06/23/2015  Review of Systems Denies dysphagia and sob.     Objective:   Physical Exam VITAL SIGNS:  See vs page GENERAL: no distress Neck: 3 cm right nodule is again easily palpable, and freely mobile.   Skin: not diaphoretic Neuro: no tremor.   Lab Results  Component Value Date   TSH 0.44 09/24/2015   T4TOTAL 9.4  09/10/2014      Assessment & Plan:  Thyroid nodule: clinically unchanged.  Euthyroid.   Patient is advised the following: Patient Instructions  blood tests are requested for you today.  We'll let you know about the results.  If it is overactive, you should consider taking a pill of radioactive iodine.  It works like this:  let's check a thyroid "scan" (a special, but easy and painless type of thyroid x ray).  you go to the x-ray department of the hospital to swallow a pill, which contains a miniscule amount of radiation.  You will not notice any symptoms from this.  You will go back to the x-ray department the next day, to lie down in front of a camera.  The results of this will be sent to me.   Based on the results, i hope to order for you a treatment pill of radioactive iodine.  Although it is a larger amount of radiation, you will again notice no symptoms from this.  The pill is gone from your body in a few days (during which you should stay away from other people), but takes several months to work.  Therefore, please return here approximately 6-8 weeks after the  treatment.  This treatment has been available for many years, and the only known side-effect is an underactive thyroid.  It is possible that i would eventually prescribe for you a thyroid hormone pill, which is very inexpensive.  You don't have to worry about side-effects of this thyroid hormone pill, because it is the same molecule your thyroid makes. Let's recheck the ultrasound.  you will receive a phone call, about a day and time for an appointment.

## 2015-09-24 NOTE — Patient Instructions (Addendum)
blood tests are requested for you today.  We'll let you know about the results.  If it is overactive, you should consider taking a pill of radioactive iodine.  It works like this:  let's check a thyroid "scan" (a special, but easy and painless type of thyroid x ray).  you go to the x-ray department of the hospital to swallow a pill, which contains a miniscule amount of radiation.  You will not notice any symptoms from this.  You will go back to the x-ray department the next day, to lie down in front of a camera.  The results of this will be sent to me.   Based on the results, i hope to order for you a treatment pill of radioactive iodine.  Although it is a larger amount of radiation, you will again notice no symptoms from this.  The pill is gone from your body in a few days (during which you should stay away from other people), but takes several months to work.  Therefore, please return here approximately 6-8 weeks after the treatment.  This treatment has been available for many years, and the only known side-effect is an underactive thyroid.  It is possible that i would eventually prescribe for you a thyroid hormone pill, which is very inexpensive.  You don't have to worry about side-effects of this thyroid hormone pill, because it is the same molecule your thyroid makes. Let's recheck the ultrasound.  you will receive a phone call, about a day and time for an appointment.

## 2015-09-27 ENCOUNTER — Ambulatory Visit
Admission: RE | Admit: 2015-09-27 | Discharge: 2015-09-27 | Disposition: A | Discharge status: Home / Self Care | Payer: BLUE CROSS/BLUE SHIELD | Source: Ambulatory Visit | Attending: Endocrinology | Admitting: Endocrinology

## 2015-09-27 DIAGNOSIS — E042 Nontoxic multinodular goiter: Secondary | ICD-10-CM

## 2015-11-16 ENCOUNTER — Encounter: Payer: Self-pay | Admitting: Certified Nurse Midwife

## 2016-09-15 ENCOUNTER — Ambulatory Visit (INDEPENDENT_AMBULATORY_CARE_PROVIDER_SITE_OTHER): Payer: BLUE CROSS/BLUE SHIELD | Admitting: Certified Nurse Midwife

## 2016-09-15 ENCOUNTER — Encounter: Payer: Self-pay | Admitting: Certified Nurse Midwife

## 2016-09-15 VITALS — BP 124/80 | HR 68 | Resp 16 | Ht 62.25 in | Wt 131.0 lb

## 2016-09-15 DIAGNOSIS — Z124 Encounter for screening for malignant neoplasm of cervix: Secondary | ICD-10-CM

## 2016-09-15 DIAGNOSIS — Z0000 Encounter for general adult medical examination without abnormal findings: Secondary | ICD-10-CM | POA: Diagnosis not present

## 2016-09-15 DIAGNOSIS — Z01419 Encounter for gynecological examination (general) (routine) without abnormal findings: Secondary | ICD-10-CM

## 2016-09-15 DIAGNOSIS — N852 Hypertrophy of uterus: Secondary | ICD-10-CM

## 2016-09-15 LAB — COMPREHENSIVE METABOLIC PANEL
ALK PHOS: 67 U/L (ref 33–130)
ALT: 15 U/L (ref 6–29)
AST: 18 U/L (ref 10–35)
Albumin: 4.2 g/dL (ref 3.6–5.1)
BILIRUBIN TOTAL: 0.4 mg/dL (ref 0.2–1.2)
BUN: 14 mg/dL (ref 7–25)
CALCIUM: 9.1 mg/dL (ref 8.6–10.4)
CO2: 25 mmol/L (ref 20–31)
Chloride: 106 mmol/L (ref 98–110)
Creat: 0.83 mg/dL (ref 0.50–1.05)
Glucose, Bld: 95 mg/dL (ref 65–99)
Potassium: 4.4 mmol/L (ref 3.5–5.3)
Sodium: 141 mmol/L (ref 135–146)
TOTAL PROTEIN: 6.3 g/dL (ref 6.1–8.1)

## 2016-09-15 IMAGING — US US SOFT TISSUE HEAD/NECK
1 series · 13 of 25 positions shown · non-contrast
Comparison: 09/15/2014

CLINICAL DATA: Multinodular goiter

EXAM:
THYROID ULTRASOUND
TECHNIQUE: Ultrasound examination of the thyroid gland and adjacent soft
tissues was performed.

[Series 1: us soft tissue head/neck · 0.06mm/px · 13 of 63 slices shown]
[im 1/63]
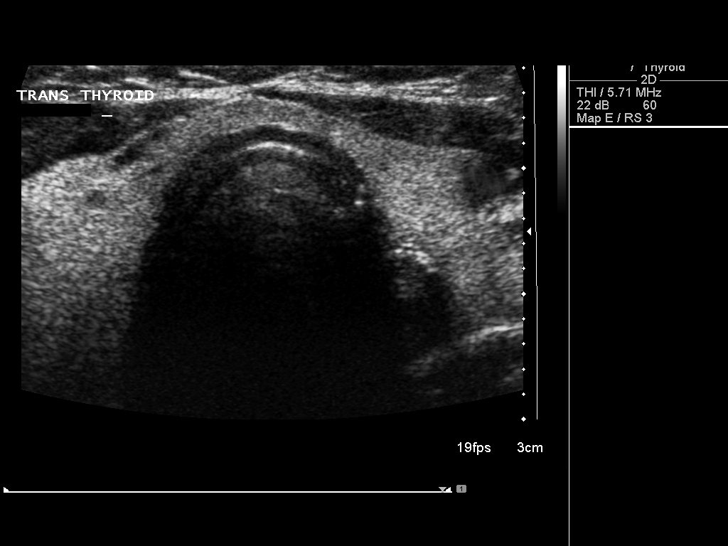
[im 6/63]
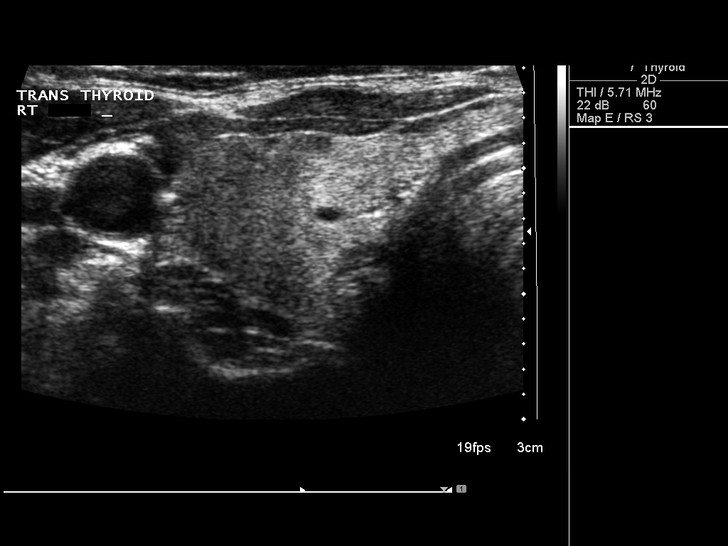
[im 11/63]
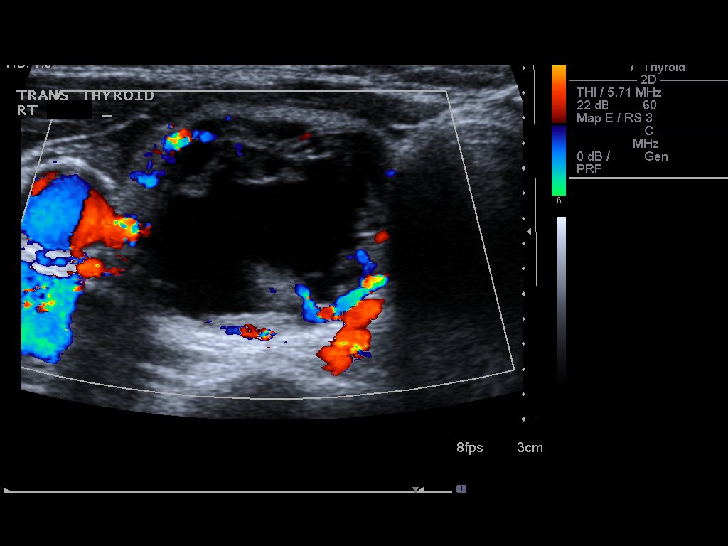
[im 16/63]
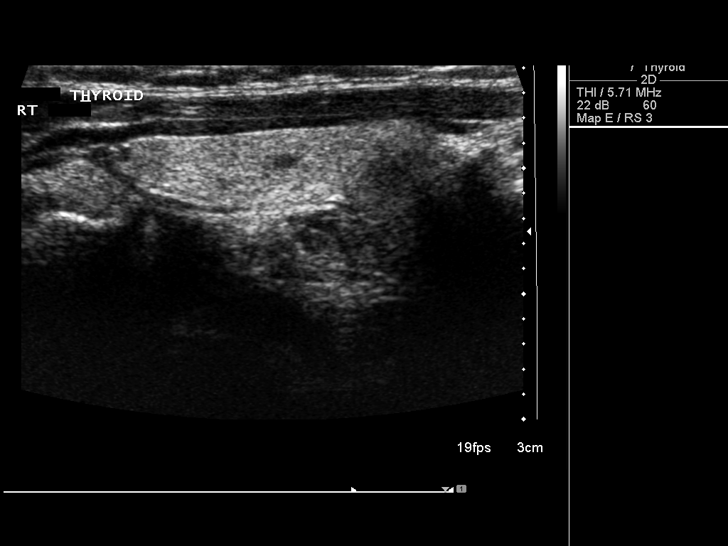
[im 21/63]
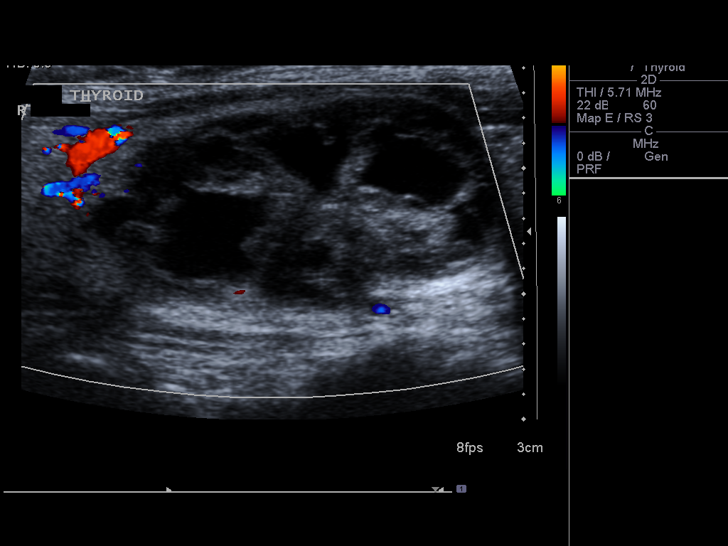
[im 26/63]
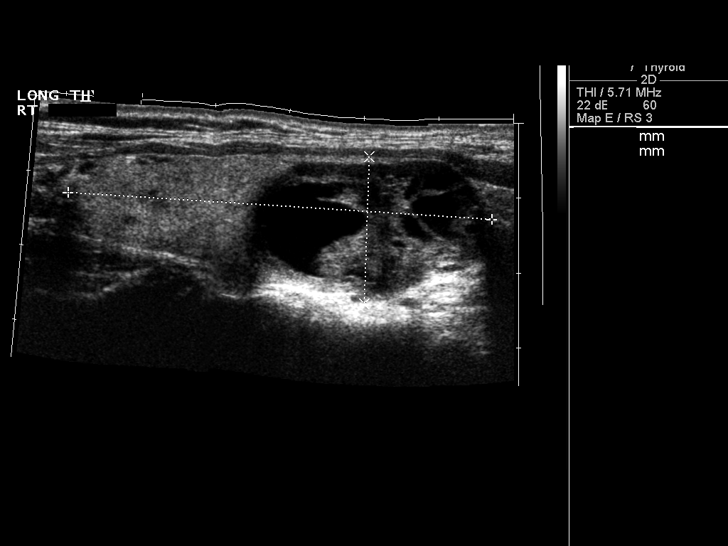
[im 32/63]
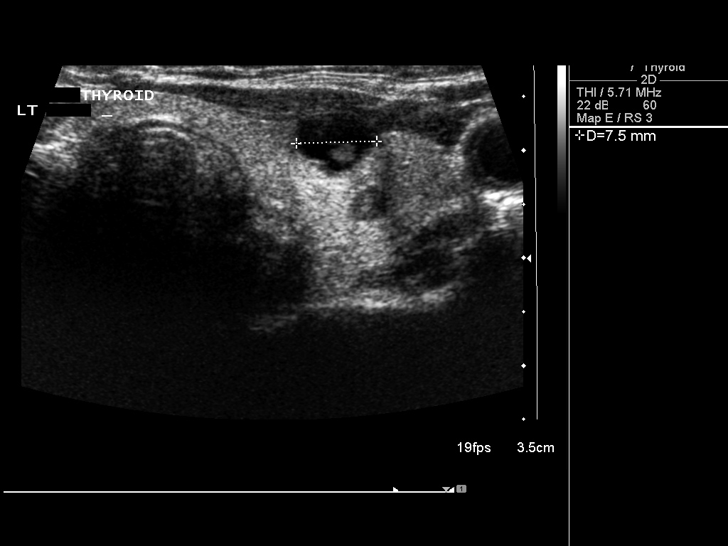
[im 37/63]
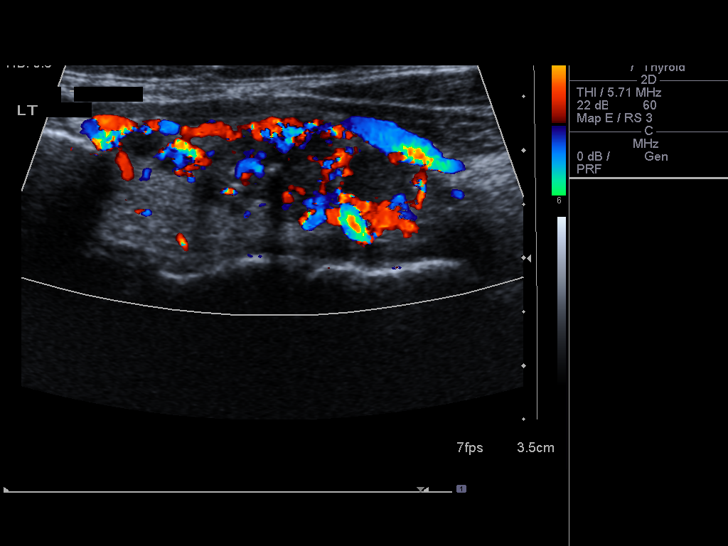
[im 42/63]
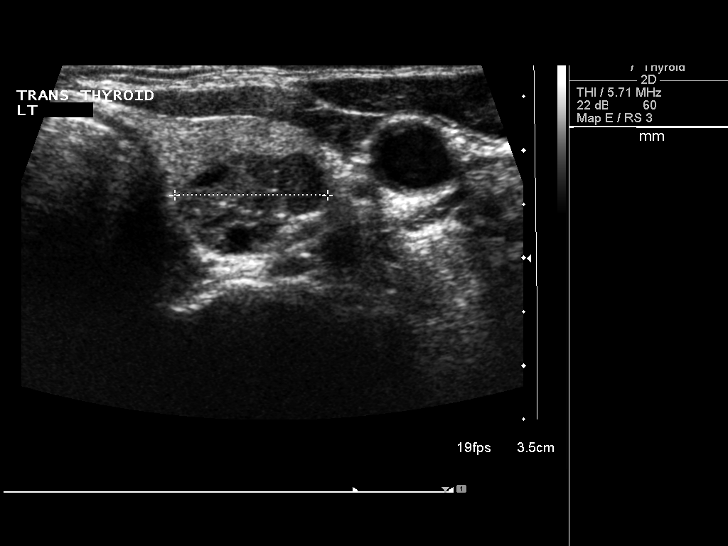
[im 47/63]
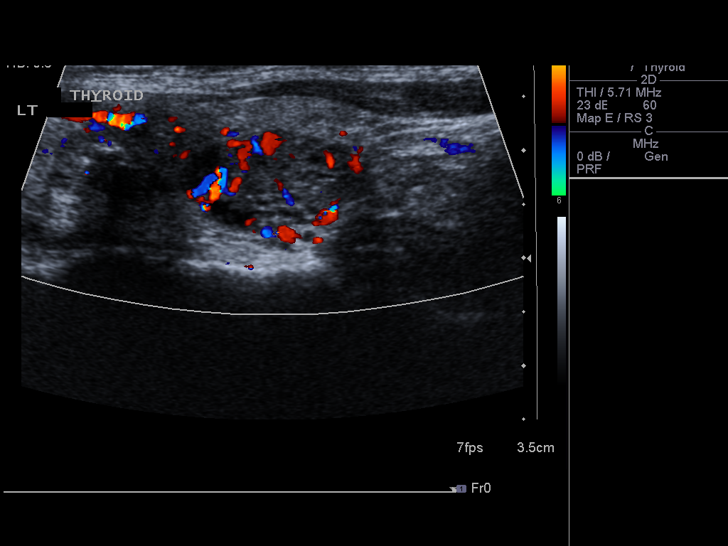
[im 52/63]
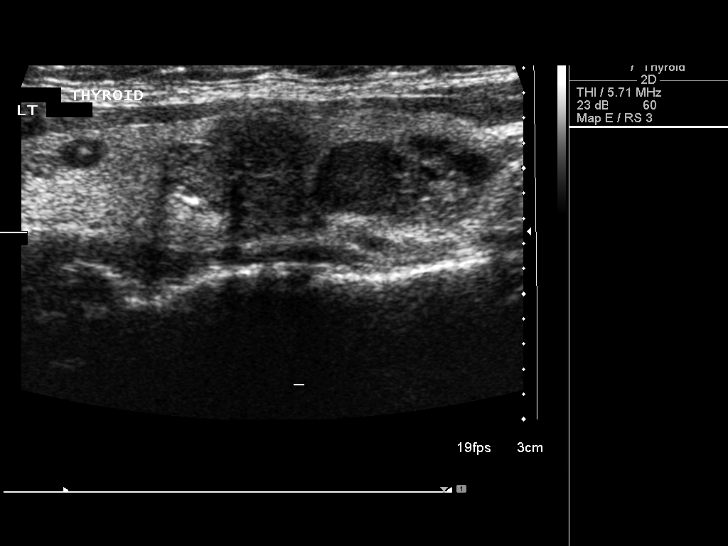
[im 57/63]
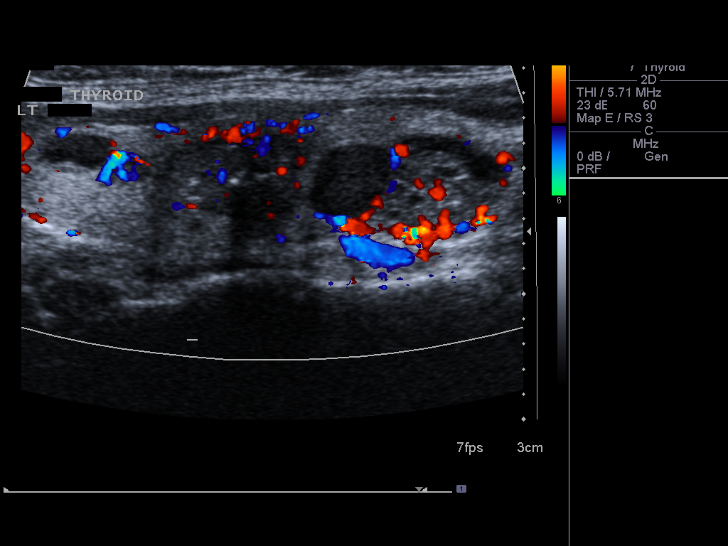
[im 63/63]
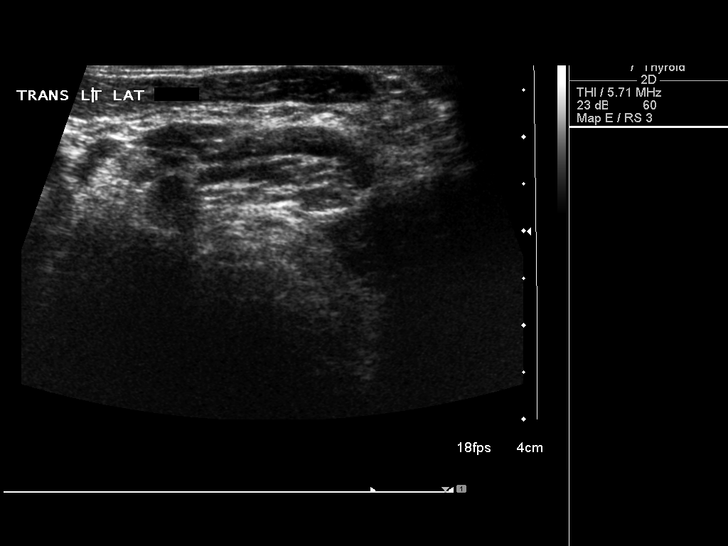

[13 of 25 positions shown; findings below may reference images not displayed]

FINDINGS: Right thyroid lobe

Measurements: 5.7 x 2.0 x 2.2 cm. Right mid to lower pole complex
cystic solid nodule measuring 3.3 x 1.7 x 1.9 cm. No significant
interval change. No new right thyroid finding. Tiny sub cm cystic
nodules in the right upper pole also noted.

Left thyroid lobe

Measurements: 4.5 x 1.4 x 2.0 cm. Stable multiple nodules again
noted throughout the left lobe. Largest in the lower pole measures
16 x 10 x 14 mm with a similar appearance. Other cystic nodules are
unchanged. No new left thyroid abnormality or significant enlarging
nodule.

Isthmus

Thickness: 2 mm.  No nodules visualized.

Lymphadenopathy

None visualized.
IMPRESSION: Stable bilateral thyroid nodules, largest cystic nodule in the right
lobe measures 3.3 cm.

Findings do not meet current SRU consensus criteria for biopsy.
Follow-up by clinical exam is recommended. If patient has known risk
factors for thyroid carcinoma, consider follow-up ultrasound in 12
months. If patient is clinically hyperthyroid, consider nuclear
medicine thyroid uptake and scan.Reference: Management of Thyroid
Nodules Detected at US: Society of Radiologists in Ultrasound

## 2016-09-15 NOTE — Progress Notes (Signed)
56 y.o. G80P2002 Married  Caucasian Fe here for annual exam. Post menopausal no HRT. Denies vaginal bleeding or vaginal dryness. Sees Endocrine for thyroid management and PCP prn. No health issues in the past year or any concerns today. 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  . [name] was 06/23/2015.          Sexually active: Yes.    The current method of family planning is post menopausal status  & condoms  Exercising: Yes.    walking Smoker:  no  Health Maintenance: Pap:  09-10-14 neg History of Abnormal Pap: no MMG:  6/17 left breast, 8/17 bilateral & left breast calcification Self Breast exams: occ Colonoscopy:  5/15 neg f/u 30yrs BMD:   none TDaP:  2011 Shingles: no Pneumonia: no Hep C and HIV: during pregnancy & for life insurance Labs: none   reports that she has never smoked. She has never used smokeless tobacco. She reports that she drinks about 1.2 - 2.4 oz of alcohol per week . She reports that she does not use drugs.  History reviewed. No pertinent past medical history.  Past Surgical History:  Procedure Laterality Date  . MOLE REMOVAL  1/15   4 removed-negative    Current Outpatient Prescriptions  Medication Sig Dispense Refill  . ibuprofen (ADVIL,MOTRIN) 200 MG tablet Take 200 mg by mouth as needed.     No current facility-administered medications for this visit.     Family History  Problem Relation Age of Onset  . Lung disease Father   . Hypertension Mother   . Cancer Sister     hodgekins  . Thyroid cancer Paternal Grandmother   . Thyroid disease Neg Hx     ROS:  Pertinent items are noted in HPI.  Otherwise, a comprehensive ROS was negative.  Exam:   BP 124/80   Pulse 68   Resp 16   Ht 5' 2.25" (1.581 m)   Wt 131 lb (59.4 kg)   LMP 06/23/2015 Comment: spotting in july  BMI 23.77 kg/m  Height: 5' 2.25" (158.1 cm) Ht Readings from Last 3 Encounters:  09/15/16 5' 2.25" (1.581 m)  09/24/15 5' 2.5" (1.588 m)   09/15/15 5' 2.25" (1.581 m)    General appearance: alert, cooperative and appears stated age Head: Normocephalic, without obvious abnormality, atraumatic Neck: no adenopathy, supple, symmetrical, trachea midline and thyroid nodular feel and enlarged (known)  Lungs: clear to auscultation bilaterally Breasts: normal appearance, no masses or tenderness, No nipple retraction or dimpling, No nipple discharge or bleeding, No axillary or supraclavicular adenopathy Heart: regular rate and rhythm Abdomen: soft, non-tender; no masses,  no organomegaly Extremities: extremities normal, atraumatic, no cyanosis or edema Skin: Skin color, texture, turgor normal. No rashes or lesions Lymph nodes: Cervical, supraclavicular, and axillary nodes normal. No abnormal inguinal nodes palpated Neurologic: Grossly normal   Pelvic: External genitalia:  no lesions              Urethra:  normal appearing urethra with no masses, tenderness or lesions              Bartholin's and Skene's: normal                 Vagina: normal appearing vagina with normal color and discharge, no lesions              Cervix: no bleeding following Pap, no cervical motion tenderness and no lesions  Pap taken: Yes.   Bimanual Exam:  Uterus:  enlarged, 8-9 week size, weeks size and normal shape              Adnexa: normal adnexa and no mass, fullness, tenderness               Rectovaginal: Confirms               Anus:  normal sphincter tone, no lesions  Chaperone present: yes  A:  Well Woman with normal exam  Menopausal no HRT  Enlarged uterus  Enlarged thyroid multinodular no change in size with Endocrine management  Screening labs    P:   Reviewed health and wellness pertinent to exam  Aware if vaginal bleeding to advise  Discussed finding and need to evaluate with PUS. Patient agreeable. Discussed possible etiology of fibroids, adenomyosis or mass. Questions addressed. Patient will be called with insurance  information and scheduled.  Labs:CMP,Vitamin D  Pap smear: yes  counseled on breast self exam, mammography screening, menopause, adequate intake of calcium and vitamin D, diet and exercise  return annually or prn  An After Visit Summary was printed and given to the patient.

## 2016-09-15 NOTE — Patient Instructions (Signed)

## 2016-09-16 LAB — VITAMIN D 25 HYDROXY (VIT D DEFICIENCY, FRACTURES): VIT D 25 HYDROXY: 47 ng/mL (ref 30–100)

## 2016-09-19 ENCOUNTER — Telehealth: Payer: Self-pay | Admitting: Certified Nurse Midwife

## 2016-09-19 LAB — IPS PAP TEST WITH HPV

## 2016-09-19 NOTE — Telephone Encounter (Signed)
Patient returned call. Reviewed benefit for an ultrasound Patient understood and agreeable. Patient ready to schedule. Patient scheduled 09/21/16 with Dr Sabra Heck. Patient aware of date, arrival time and cancellation policy.  Patient had no further questions.  Routing to Dr Sabra Heck  cc: [name]

## 2016-09-19 NOTE — Telephone Encounter (Signed)
Called patient to review benefits for a recommended ultrasound. Left Voicemail requesting a call back. °

## 2016-09-21 ENCOUNTER — Other Ambulatory Visit: Payer: Self-pay | Admitting: Obstetrics & Gynecology

## 2016-09-21 ENCOUNTER — Other Ambulatory Visit: Payer: Self-pay

## 2016-09-21 ENCOUNTER — Ambulatory Visit (INDEPENDENT_AMBULATORY_CARE_PROVIDER_SITE_OTHER): Payer: BLUE CROSS/BLUE SHIELD | Admitting: Obstetrics & Gynecology

## 2016-09-21 ENCOUNTER — Ambulatory Visit (INDEPENDENT_AMBULATORY_CARE_PROVIDER_SITE_OTHER): Payer: BLUE CROSS/BLUE SHIELD

## 2016-09-21 VITALS — BP 130/70 | HR 78 | Resp 14 | Ht 62.25 in | Wt 131.0 lb

## 2016-09-21 DIAGNOSIS — N950 Postmenopausal bleeding: Secondary | ICD-10-CM | POA: Diagnosis not present

## 2016-09-21 DIAGNOSIS — N9489 Other specified conditions associated with female genital organs and menstrual cycle: Secondary | ICD-10-CM

## 2016-09-21 DIAGNOSIS — N852 Hypertrophy of uterus: Secondary | ICD-10-CM

## 2016-09-21 NOTE — Progress Notes (Signed)
56 y.o. G2P2 MWF here for a pelvic ultrasound with possible sonohystogram due to PMP bleeding.  Pt has experienced several episodes of PMP bleeding.  She has not been worried about it until the last one which she felt really shouldn't be happening at this point.  Pt did have [place] 5/16 that was 59.8.  D/w pt this shows clear menopausal range and yes, she should not be bleeding.  Pt asks about pregnancy risks with bleeding.  Reassured pt pregnancy is not a risk due to her elevated FSH.  [name] was 06/23/2015.  Contraception:  Condoms  Technique:  Both transabdominal and transvaginal ultrasound examinations of the pelvis were performed. Transabdominal technique was performed for global imaging of the pelvis including uterus, ovaries, adnexal regions, and pelvic cul-de-sac.  It was necessary to proceed with endovaginal exam following the abdominal ultrasound transabdominal exam to visualize the endometrium and adnexa.  Color and duplex Doppler ultrasound was utilized to evaluate blood flow to the ovaries.    FINDINGS: Uterus: 12.7 x 9.1 x 7.3cm with 7.8 x 7.4 x 7.8cm fibroids, off to left and intramural Endometrium: 7.17mm, possible mass noted Adnexa:  Left: 3.0 x 2.2 x 1.3cm with 1.9 x 5.2WU follicle     Right: 1.9 x 1.4 x 0.8cm Cul de sac: no free lfuid  SHSG:  After obtaining appropriate verbal consent from patient, the cervix was visualized using a speculum, and prepped with betadine.  A tenaculum  was applied to the cervix.  Dilation of the cervix was not necessary. The catheter was passed into the uterus and sterile saline introduced, with the following findings: irregularity of anterior and posterior wall of endometrium along mid portion of cavity noted, encroachment of fibroid noted as well  Discussion:  Findings reviewed with pt.  Due to irregularity of endometrial wall, biopsy vs hysteroscopic visualization and resection discussed.  Advised pt biopsy has possibility of  missing pathology and would likely not eliminate bleeding.  My recommendation would be to proceed with hysteroscopy.  Procedure discussed with patient.  Recovery and pain management discussed.  Risks discussed including but not limited to bleeding, rare risk of transfusion, infection, 1% risk of uterine perforation with risks of fluid deficit causing cardiac arrythmia, cerebral swelling and/or need to stop procedure early.  Fluid emboli and rare risk of death discussed.  DVT/PE, rare risk of risk of bowel/bladder/ureteral/vascular injury.  Patient aware if pathology abnormal she may need additional treatment.  All questions answered.  She would like to consider dates as well today.  Physical Exam  Constitutional: She appears well-developed and well-nourished.  Cardiovascular: Normal rate and regular rhythm.   Respiratory: Effort normal and breath sounds normal.  GI: Soft. Bowel sounds are normal.  Genitourinary: Vagina normal. There is no rash, tenderness, lesion or injury on the right labia. There is no rash, tenderness, lesion or injury on the left labia. Uterus is enlarged (12 weeks but mobile). Cervix exhibits no motion tenderness. Right adnexum displays no mass, no tenderness and no fullness. Left adnexum displays no mass, no tenderness and no fullness.  Lymphadenopathy:       Right: No inguinal adenopathy present.       Left: No inguinal adenopathy present.  Skin: Skin is warm and dry.  Psychiatric: She has a normal mood and affect.    Assessment: Enlarged uterus with 7cm fibroid Irregular appearing endometrium, possible polyp PMP bleeding  Plan:   Hysteroscopy with possible polyp resection, D&C planned Follow up repeat PUS in  3-4 months is recommended at well to ensure stable fibroid finding.  D/W pt rare possibility of sarcoma and my desire to ensure this large fibroid is not growing as I have no prior imaging comparisons.  Pt comfortable with plan.  ~40 minutes spent with patient  >50% of time was in face to face discussion of above.

## 2016-09-22 ENCOUNTER — Ambulatory Visit (INDEPENDENT_AMBULATORY_CARE_PROVIDER_SITE_OTHER): Payer: BLUE CROSS/BLUE SHIELD | Admitting: Endocrinology

## 2016-09-22 ENCOUNTER — Telehealth: Payer: Self-pay | Admitting: Obstetrics & Gynecology

## 2016-09-22 ENCOUNTER — Encounter: Payer: Self-pay | Admitting: Endocrinology

## 2016-09-22 ENCOUNTER — Encounter (HOSPITAL_BASED_OUTPATIENT_CLINIC_OR_DEPARTMENT_OTHER): Payer: Self-pay | Admitting: *Deleted

## 2016-09-22 ENCOUNTER — Other Ambulatory Visit: Payer: Self-pay

## 2016-09-22 DIAGNOSIS — E042 Nontoxic multinodular goiter: Secondary | ICD-10-CM | POA: Diagnosis not present

## 2016-09-22 LAB — TSH: TSH: 1.06 u[IU]/mL (ref 0.35–4.50)

## 2016-09-22 NOTE — Patient Instructions (Addendum)
We can skip the ultrasound this year.   blood tests are requested for you today.  We'll let you know about the results. If it is overactive, you should consider taking the radioactive iodine pill.  It works like this: We would first check a thyroid "scan" (a special, but easy and painless type of thyroid x ray).  you go to the x-ray department of the hospital to swallow a pill, which contains a miniscule amount of radiation.  You will not notice any symptoms from this.  You will go back to the x-ray department the next day, to lie down in front of a camera.  The results of this will be sent to me.   Based on the results, i hope to order for you a treatment pill of radioactive iodine.  Although it is a larger amount of radiation, you will again notice no symptoms from this.  The pill is gone from your body in a few days (during which you should stay away from other people), but takes several months to work.  Therefore, please return here approximately 6-8 weeks after the treatment.  This treatment has been available for many years, and the only known side-effect is an underactive thyroid.  It is possible that i would eventually prescribe for you a thyroid hormone pill, which is very inexpensive.  You don't have to worry about side-effects of this thyroid hormone pill, because it is the same molecule your thyroid makes. Please return in 1 year.

## 2016-09-22 NOTE — Telephone Encounter (Signed)
Patient is returning a call to Suzy. °

## 2016-09-22 NOTE — Progress Notes (Signed)
NPO AFTER MN.  ARRIVE AT 0700.  NEEDS HG.

## 2016-09-22 NOTE — Telephone Encounter (Signed)
Called patient to review benefits for a recommended surgical procedure. Left Voicemail requesting a call back.

## 2016-09-22 NOTE — Progress Notes (Signed)
Subjective:    Patient ID: Emily Montgomery, female    DOB: 02/19/1961, 56 y.o.   MRN: [id number]  HPI Pt returns for f/u of multinodular goiter, with largest nodule being a cyst (dx'ed early 2016; bx in 2016 showed cystic fluid--Beth Cat 1; f/u US in 2017 was unchanged). She does not notice the nodule.  Past Medical History:  Diagnosis Date  . Family history of adverse reaction to anesthesia    sister- ponv  . Multinodular thyroid    goiter--- per pt consulted w/ endocrinologist, told benign cyst  . PMB (postmenopausal bleeding)   . Thickened endometrium    and endometrial irregularity    Past Surgical History:  Procedure Laterality Date  . COLONOSCOPY WITH PROPOFOL  2015  . TONSILLECTOMY AND ADENOIDECTOMY  child  . WISDOM TOOTH EXTRACTION  20 approx.    Social History   Social History  . Marital status: Married    Spouse name: N/A  . Number of children: N/A  . Years of education: N/A   Occupational History  . Not on file.   Social History Main Topics  . Smoking status: Never Smoker  . Smokeless tobacco: Never Used  . Alcohol use 1.2 - 2.4 oz/week    2 - 4 Standard drinks or equivalent per week     Comment: occasional  . Drug use: No  . Sexual activity: Yes    Partners: Male    Birth control/ protection: Condom   Other Topics Concern  . Not on file   Social History Narrative  . No narrative on file    Current Outpatient Prescriptions on File Prior to Visit  Medication Sig Dispense Refill  . ibuprofen (ADVIL,MOTRIN) 200 MG tablet Take 200 mg by mouth as needed.     No current facility-administered medications on file prior to visit.     No Known Allergies  Family History  Problem Relation Age of Onset  . Lung disease Father   . Hypertension Mother   . Cancer Sister     hodgekins  . Thyroid cancer Paternal Grandmother   . Thyroid disease Neg Hx     BP 126/82   Pulse 63   Ht 5' 2.5" (1.588 m)   Wt 130 lb (59 kg)   LMP 06/23/2015   SpO2 97%    BMI 23.40 kg/m    Review of Systems Denies sob    Objective:   Physical Exam VITAL SIGNS:  See vs page GENERAL: no distress Neck: 2 cm right nodule is again easily palpable, and freely mobile.   Skin: not diaphoretic Neuro: no tremor.   Lab Results  Component Value Date   TSH 0.44 09/24/2015   T4TOTAL 9.4 09/10/2014      Assessment & Plan:  Multinodular goiter, clinically stable.  Borderline hyperthyroidism.   She is at risk for the development of overt hyperthyroidism.   Patient Instructions  We can skip the ultrasound this year.   blood tests are requested for you today.  We'll let you know about the results. If it is overactive, you should consider taking the radioactive iodine pill.  It works like this: We would first check a thyroid "scan" (a special, but easy and painless type of thyroid x ray).  you go to the x-ray department of the hospital to swallow a pill, which contains a miniscule amount of radiation.  You will not notice any symptoms from this.  You will go back to the x-ray department the next day, to  lie down in front of a camera.  The results of this will be sent to me.   Based on the results, i hope to order for you a treatment pill of radioactive iodine.  Although it is a larger amount of radiation, you will again notice no symptoms from this.  The pill is gone from your body in a few days (during which you should stay away from other people), but takes several months to work.  Therefore, please return here approximately 6-8 weeks after the treatment.  This treatment has been available for many years, and the only known side-effect is an underactive thyroid.  It is possible that i would eventually prescribe for you a thyroid hormone pill, which is very inexpensive.  You don't have to worry about side-effects of this thyroid hormone pill, because it is the same molecule your thyroid makes. Please return in 1 year.

## 2016-09-23 ENCOUNTER — Encounter: Payer: Self-pay | Admitting: Obstetrics & Gynecology

## 2016-09-26 ENCOUNTER — Other Ambulatory Visit: Payer: Self-pay | Admitting: Obstetrics & Gynecology

## 2016-09-26 ENCOUNTER — Telehealth: Payer: Self-pay | Admitting: Obstetrics & Gynecology

## 2016-09-26 NOTE — Telephone Encounter (Signed)
Patient has questions about her procedure on Friday.

## 2016-09-26 NOTE — Telephone Encounter (Signed)
The blood came from within the uterine cavity and not from the vagina so the coconut oil wasn't the cause.  Yes, I can remove the skin lesion.  I will add it to her consent form and we will confirm the location the morning before the procedure.

## 2016-09-26 NOTE — Telephone Encounter (Signed)
Left message to call Kaiyah Eber at [phone].  

## 2016-09-26 NOTE — Telephone Encounter (Signed)
Call transferred to me from [facility]. Reviewed benefit with patient for hysteroscopy.  Patient understood and agreeable.  Patient aware this is professional benefit only. Patient aware will be contacted by hospital for separate benefits. Patient is scheduled for procedure on 09/29/16.    Advised patient, per her conversation with Glorianne Manchester, if additional procedures are added to this surgical date, I will contact her with any additional benefit information.  Routing to Dr Sabra Heck  cc: Glorianne Manchester

## 2016-09-26 NOTE — Telephone Encounter (Signed)
Spoke with patient, advised as seen below per Dr. Sabra Heck. Patient verbalizes understanding and is agreeable. Patient also has questions about benefits for upcoming surgery, call forwarded to insurance and benefits department for further assistance.  Routing to provider for final review. Patient is agreeable to disposition. Will close encounter.    Cc: Emily Montgomery

## 2016-09-26 NOTE — Telephone Encounter (Signed)
Spoke with patient. Patient states she wanted to update Dr. Sabra Heck on something that she thought about after [facility] on 5/3. Patient remembers Dr. Sabra Heck commenting on "globby blood" and did not think anything of it, but remembered later she had used coconut oil vaginally 1-2 days before and thought maybe this could have been the substance. Advised patient would update Dr. Sabra Heck. Patient states Dr. Sabra Heck also noted a skin tag/mole in the panty line at last OV and would like to know if this can be removed during surgery scheduled for 09/29/16? Advised patient would review with Dr. Sabra Heck and return call with recommendations. Advised Dr. Sabra Heck is out of the office this morning, response may not be immediate, patient is agreeable.   Dr. Sabra Heck -please advise on skin tag removal?

## 2016-09-29 ENCOUNTER — Encounter (HOSPITAL_BASED_OUTPATIENT_CLINIC_OR_DEPARTMENT_OTHER): Payer: Self-pay | Admitting: *Deleted

## 2016-09-29 ENCOUNTER — Ambulatory Visit (HOSPITAL_BASED_OUTPATIENT_CLINIC_OR_DEPARTMENT_OTHER): Payer: BLUE CROSS/BLUE SHIELD | Admitting: Anesthesiology

## 2016-09-29 ENCOUNTER — Encounter (HOSPITAL_BASED_OUTPATIENT_CLINIC_OR_DEPARTMENT_OTHER)
Admission: RE | Disposition: A | Discharge status: Home / Self Care | Payer: Self-pay | Source: Ambulatory Visit | Attending: Obstetrics & Gynecology

## 2016-09-29 ENCOUNTER — Ambulatory Visit (HOSPITAL_BASED_OUTPATIENT_CLINIC_OR_DEPARTMENT_OTHER)
Admission: RE | Admit: 2016-09-29 | Discharge: 2016-09-29 | Disposition: A | Discharge status: Home / Self Care | Payer: BLUE CROSS/BLUE SHIELD | Source: Ambulatory Visit | Attending: Obstetrics & Gynecology | Admitting: Obstetrics & Gynecology

## 2016-09-29 DIAGNOSIS — D225 Melanocytic nevi of trunk: Secondary | ICD-10-CM | POA: Diagnosis not present

## 2016-09-29 DIAGNOSIS — N852 Hypertrophy of uterus: Secondary | ICD-10-CM

## 2016-09-29 DIAGNOSIS — D251 Intramural leiomyoma of uterus: Secondary | ICD-10-CM | POA: Diagnosis not present

## 2016-09-29 DIAGNOSIS — Z79899 Other long term (current) drug therapy: Secondary | ICD-10-CM | POA: Insufficient documentation

## 2016-09-29 DIAGNOSIS — N950 Postmenopausal bleeding: Secondary | ICD-10-CM | POA: Diagnosis not present

## 2016-09-29 DIAGNOSIS — D250 Submucous leiomyoma of uterus: Secondary | ICD-10-CM | POA: Diagnosis not present

## 2016-09-29 HISTORY — DX: Nontoxic multinodular goiter: E04.2

## 2016-09-29 HISTORY — DX: Family history of other specified conditions: Z84.89

## 2016-09-29 HISTORY — PX: DILATATION & CURETTAGE/HYSTEROSCOPY WITH MYOSURE: SHX6511

## 2016-09-29 HISTORY — DX: Abnormal findings on diagnostic imaging of other specified body structures: R93.89

## 2016-09-29 LAB — POCT HEMOGLOBIN-HEMACUE: Hemoglobin: 15.2 g/dL — ABNORMAL HIGH (ref 12.0–15.0)

## 2016-09-29 SURGERY — DILATATION & CURETTAGE/HYSTEROSCOPY WITH MYOSURE
Anesthesia: General | Site: Vagina

## 2016-09-29 MED ORDER — LACTATED RINGERS IV SOLN
INTRAVENOUS | Status: DC
  Administered 2016-09-29: 08:00:00 via INTRAVENOUS
  Filled 2016-09-29: qty 1000

## 2016-09-29 MED ORDER — FENTANYL CITRATE (PF) 100 MCG/2ML IJ SOLN
INTRAMUSCULAR | Status: DC | PRN
  Administered 2016-09-29: 50 ug via INTRAVENOUS

## 2016-09-29 MED ORDER — FENTANYL CITRATE (PF) 100 MCG/2ML IJ SOLN
25.0000 ug | INTRAMUSCULAR | Status: DC | PRN
  Filled 2016-09-29: qty 1

## 2016-09-29 MED ORDER — DEXAMETHASONE SODIUM PHOSPHATE 10 MG/ML IJ SOLN
INTRAMUSCULAR | Status: DC | PRN
  Administered 2016-09-29: 10 mg via INTRAVENOUS

## 2016-09-29 MED ORDER — CEFAZOLIN SODIUM 1 G IJ SOLR
INTRAMUSCULAR | Status: AC
  Filled 2016-09-29: qty 20

## 2016-09-29 MED ORDER — OXYCODONE HCL 5 MG PO TABS
5.0000 mg | ORAL_TABLET | Freq: Once | ORAL | Status: DC | PRN
  Filled 2016-09-29: qty 1

## 2016-09-29 MED ORDER — HYDROCODONE-ACETAMINOPHEN 5-325 MG PO TABS: 1.0000 | ORAL_TABLET | Freq: Four times a day (QID) | ORAL | 0 refills | Status: DC | PRN

## 2016-09-29 MED ORDER — OXYCODONE HCL 5 MG/5ML PO SOLN
5.0000 mg | Freq: Once | ORAL | Status: DC | PRN
  Filled 2016-09-29: qty 5

## 2016-09-29 MED ORDER — DEXAMETHASONE SODIUM PHOSPHATE 10 MG/ML IJ SOLN
INTRAMUSCULAR | Status: AC
  Filled 2016-09-29: qty 1

## 2016-09-29 MED ORDER — SODIUM CHLORIDE 0.9 % IR SOLN
Status: DC | PRN
  Administered 2016-09-29: 3000 mL

## 2016-09-29 MED ORDER — LIDOCAINE 2% (20 MG/ML) 5 ML SYRINGE
INTRAMUSCULAR | Status: DC | PRN
  Administered 2016-09-29: 60 mg via INTRAVENOUS

## 2016-09-29 MED ORDER — ONDANSETRON HCL 4 MG/2ML IJ SOLN
INTRAMUSCULAR | Status: AC
  Filled 2016-09-29: qty 2

## 2016-09-29 MED ORDER — KETOROLAC TROMETHAMINE 30 MG/ML IJ SOLN
INTRAMUSCULAR | Status: AC
  Filled 2016-09-29: qty 1

## 2016-09-29 MED ORDER — PROPOFOL 10 MG/ML IV BOLUS
INTRAVENOUS | Status: DC | PRN
  Administered 2016-09-29: 200 mg via INTRAVENOUS

## 2016-09-29 MED ORDER — LIDOCAINE 2% (20 MG/ML) 5 ML SYRINGE
INTRAMUSCULAR | Status: AC
  Filled 2016-09-29: qty 5

## 2016-09-29 MED ORDER — KETOROLAC TROMETHAMINE 30 MG/ML IJ SOLN
30.0000 mg | Freq: Once | INTRAMUSCULAR | Status: DC | PRN
  Filled 2016-09-29: qty 1

## 2016-09-29 MED ORDER — BUPIVACAINE HCL (PF) 0.25 % IJ SOLN
INTRAMUSCULAR | Status: DC | PRN
  Administered 2016-09-29: 2 mL

## 2016-09-29 MED ORDER — KETOROLAC TROMETHAMINE 30 MG/ML IJ SOLN
INTRAMUSCULAR | Status: DC | PRN
Start: 2016-09-29 — End: 2016-09-29
  Administered 2016-09-29: 30 mg via INTRAVENOUS

## 2016-09-29 MED ORDER — LIDOCAINE-EPINEPHRINE 1 %-1:100000 IJ SOLN
INTRAMUSCULAR | Status: DC | PRN
Start: 2016-09-29 — End: 2016-09-29
  Administered 2016-09-29: 10 mL

## 2016-09-29 MED ORDER — CEFAZOLIN SODIUM-DEXTROSE 2-3 GM-% IV SOLR
2.0000 g | Freq: Once | INTRAVENOUS | Status: AC
  Administered 2016-09-29: 2 g via INTRAVENOUS
  Filled 2016-09-29: qty 50

## 2016-09-29 MED ORDER — MIDAZOLAM HCL 5 MG/5ML IJ SOLN
INTRAMUSCULAR | Status: DC | PRN
  Administered 2016-09-29: 2 mg via INTRAVENOUS

## 2016-09-29 MED ORDER — ONDANSETRON HCL 4 MG/2ML IJ SOLN
INTRAMUSCULAR | Status: DC | PRN
  Administered 2016-09-29: 4 mg via INTRAVENOUS

## 2016-09-29 MED ORDER — IBUPROFEN 800 MG PO TABS: 800.0000 mg | ORAL_TABLET | Freq: Three times a day (TID) | ORAL | 0 refills | Status: DC | PRN

## 2016-09-29 MED ORDER — PROMETHAZINE HCL 25 MG/ML IJ SOLN
6.2500 mg | INTRAMUSCULAR | Status: DC | PRN
  Filled 2016-09-29: qty 1

## 2016-09-29 MED ORDER — CEFAZOLIN SODIUM 1 G IJ SOLR
INTRAMUSCULAR | Status: DC | PRN
  Administered 2016-09-29: 2 g via INTRAMUSCULAR

## 2016-09-29 MED ORDER — MIDAZOLAM HCL 2 MG/2ML IJ SOLN
INTRAMUSCULAR | Status: AC
  Filled 2016-09-29: qty 2

## 2016-09-29 MED ORDER — FENTANYL CITRATE (PF) 100 MCG/2ML IJ SOLN
INTRAMUSCULAR | Status: AC
  Filled 2016-09-29: qty 2

## 2016-09-29 MED ORDER — PROPOFOL 10 MG/ML IV BOLUS
INTRAVENOUS | Status: AC
  Filled 2016-09-29: qty 20

## 2016-09-29 SURGICAL SUPPLY — 38 items
BANDAGE ADH SHEER 1  50/CT (GAUZE/BANDAGES/DRESSINGS) ×3 IMPLANT
BIPOLAR CUTTING LOOP 21FR (ELECTRODE)
BLADE SURG 15 STRL LF DISP TIS (BLADE) ×1 IMPLANT
BLADE SURG 15 STRL SS (BLADE) ×2
CANISTER SUCT 3000ML PPV (MISCELLANEOUS) ×3 IMPLANT
CATH ROBINSON RED A/P 16FR (CATHETERS) ×3 IMPLANT
CLOTH BEACON ORANGE TIMEOUT ST (SAFETY) ×3 IMPLANT
COVER BACK TABLE 60X90IN (DRAPES) ×3 IMPLANT
DEVICE MYOSURE LITE (MISCELLANEOUS) IMPLANT
DEVICE MYOSURE REACH (MISCELLANEOUS) IMPLANT
DILATOR CANAL MILEX (MISCELLANEOUS) IMPLANT
DRSG COVADERM PLUS 2X2 (GAUZE/BANDAGES/DRESSINGS) ×3 IMPLANT
DRSG TELFA 3X8 NADH (GAUZE/BANDAGES/DRESSINGS) ×3 IMPLANT
ELECT REM PT RETURN 9FT ADLT (ELECTROSURGICAL)
ELECTRODE REM PT RTRN 9FT ADLT (ELECTROSURGICAL) IMPLANT
FILTER ARTHROSCOPY CONVERTOR (FILTER) IMPLANT
GLOVE BIO SURGEON STRL SZ 6.5 (GLOVE) ×2 IMPLANT
GLOVE BIO SURGEON STRL SZ7.5 (GLOVE) ×3 IMPLANT
GLOVE BIO SURGEONS STRL SZ 6.5 (GLOVE) ×1
GLOVE BIOGEL PI IND STRL 6.5 (GLOVE) ×1 IMPLANT
GLOVE BIOGEL PI INDICATOR 6.5 (GLOVE) ×2
GLOVE ECLIPSE 6.5 STRL STRAW (GLOVE) ×6 IMPLANT
GLOVE INDICATOR 7.0 STRL GRN (GLOVE) ×3 IMPLANT
GOWN STRL REUS W/ TWL LRG LVL3 (GOWN DISPOSABLE) IMPLANT
GOWN STRL REUS W/ TWL XL LVL3 (GOWN DISPOSABLE) IMPLANT
GOWN STRL REUS W/TWL LRG LVL3 (GOWN DISPOSABLE) ×6 IMPLANT
GOWN STRL REUS W/TWL XL LVL3 (GOWN DISPOSABLE)
IV NS IRRIG 3000ML ARTHROMATIC (IV SOLUTION) ×3 IMPLANT
KIT RM TURNOVER CYSTO AR (KITS) ×3 IMPLANT
LOOP CUTTING BIPOLAR 21FR (ELECTRODE) IMPLANT
PACK VAGINAL WOMENS (CUSTOM PROCEDURE TRAY) ×3 IMPLANT
PAD OB MATERNITY 4.3X12.25 (PERSONAL CARE ITEMS) ×3 IMPLANT
PAD PREP 24X48 CUFFED NSTRL (MISCELLANEOUS) ×3 IMPLANT
SEAL ROD LENS SCOPE MYOSURE (ABLATOR) IMPLANT
TOWEL OR 17X24 6PK STRL BLUE (TOWEL DISPOSABLE) ×6 IMPLANT
TUBING AQUILEX INFLOW (TUBING) IMPLANT
TUBING AQUILEX OUTFLOW (TUBING) IMPLANT
WATER STERILE IRR 500ML POUR (IV SOLUTION) ×3 IMPLANT

## 2016-09-29 NOTE — Anesthesia Preprocedure Evaluation (Signed)
Anesthesia Evaluation  Patient identified by MRN, date of birth, ID band Patient awake    Reviewed: Allergy & Precautions, NPO status , Patient's Chart, lab work & pertinent test results  Airway Mallampati: II  TM Distance: >3 FB Neck ROM: Full    Dental no notable dental hx.    Pulmonary neg pulmonary ROS,    Pulmonary exam normal breath sounds clear to auscultation       Cardiovascular negative cardio ROS Normal cardiovascular exam Rhythm:Regular Rate:Normal     Neuro/Psych negative neurological ROS  negative psych ROS   GI/Hepatic negative GI ROS, Neg liver ROS,   Endo/Other  negative endocrine ROS  Renal/GU negative Renal ROS  negative genitourinary   Musculoskeletal negative musculoskeletal ROS (+)   Abdominal   Peds negative pediatric ROS (+)  Hematology negative hematology ROS (+)   Anesthesia Other Findings   Reproductive/Obstetrics negative OB ROS                             Anesthesia Physical Anesthesia Plan  ASA: I  Anesthesia Plan: General   Post-op Pain Management:    Induction: Intravenous  Airway Management Planned: LMA  Additional Equipment:   Intra-op Plan:   Post-operative Plan:   Informed Consent: I have reviewed the patients History and Physical, chart, labs and discussed the procedure including the risks, benefits and alternatives for the proposed anesthesia with the patient or authorized representative who has indicated his/her understanding and acceptance.   Dental advisory given  Plan Discussed with: CRNA and Surgeon  Anesthesia Plan Comments:         Anesthesia Quick Evaluation

## 2016-09-29 NOTE — H&P (Signed)
Emily Montgomery is an 56 y.o. female G2P2 here for treatment of endometrial mass noted on ultrasound done due to PMP bleeding.  Pt does have a large fibroid but I do not think this is a cause of her bleeding.  She and I have discussed alternatives, risks and benefits.  All questions answered.  She is here and ready to proceed.  Pertinent Gynecological History: Menses: post-menopausal Bleeding: post menopausal bleeding Contraception: condoms and PMP DES exposure: denies Blood transfusions: none Sexually transmitted diseases: no past history Previous GYN Procedures: none  Last mammogram: normal Date: 6/17 Last pap: normal Date: 4/18 OB History: G2, P2   Menstrual History: [name] was 06/23/2015.    Past Medical History:  Diagnosis Date  . Family history of adverse reaction to anesthesia    sister- ponv  . Multinodular thyroid    goiter--- per pt consulted w/ endocrinologist, told benign cyst  . PMB (postmenopausal bleeding)   . Thickened endometrium    and endometrial irregularity    Past Surgical History:  Procedure Laterality Date  . COLONOSCOPY WITH PROPOFOL  2015  . TONSILLECTOMY AND ADENOIDECTOMY  child  . WISDOM TOOTH EXTRACTION  20 approx.    Family History  Problem Relation Age of Onset  . Lung disease Father   . Hypertension Mother   . Cancer Sister        hodgekins  . Thyroid cancer Paternal Grandmother   . Thyroid disease Neg Hx     Social History:  reports that she has never smoked. She has never used smokeless tobacco. She reports that she drinks about 1.2 - 2.4 oz of alcohol per week . She reports that she does not use drugs.  Allergies: No Known Allergies  Prescriptions Prior to Admission  Medication Sig Dispense Refill Last Dose  . BIOTIN PO Take by mouth daily.    Taking  . Cyanocobalamin (VITAMIN B 12 PO) Take by mouth daily.    Past Month at Unknown time  . Omega-3 Fatty Acids (FISH OIL PO) Take by mouth daily.    Past  Month at Unknown time  . ibuprofen (ADVIL,MOTRIN) 200 MG tablet Take 200 mg by mouth as needed.   More than a month at Unknown time    Review of Systems  All other systems reviewed and are negative.   Blood pressure (!) 160/74, pulse 80, temperature 97.8 F (36.6 C), temperature source Oral, resp. rate 16, height 5' 2.5" (1.588 m), weight 130 lb (59 kg), last menstrual period 06/23/2015, SpO2 100 %. Physical Exam  Constitutional: She is oriented to person, place, and time. She appears well-developed and well-nourished.  Cardiovascular: Normal rate and regular rhythm.   Respiratory: Effort normal and breath sounds normal.  Neurological: She is alert and oriented to person, place, and time.  Skin: Skin is warm and dry.  Psychiatric: She has a normal mood and affect.    No results found for this or any previous visit (from the past 24 hour(s)).  No results found.  Assessment/Plan: 56 yo G2P2 with PMP bleeding with a probable endometrial polyp here for hysteroscopy with resection and endometrial mass.  I am also going to remove two vulvar lesions today.  All questions answered.  Pt ready to proceed.  Hale Bogus [name] 09/29/2016, 9:01 AM

## 2016-09-29 NOTE — Op Note (Signed)
09/29/2016  9:52 AM  PATIENT:  Emily Montgomery  56 y.o. female  PRE-OPERATIVE DIAGNOSIS:  enlarged uterus, PMB, endomentrial polyp, vulvar neoplasm x 2  POST-OPERATIVE DIAGNOSIS:  enlarged uterus, PMB, fibroid with clear submucosal component  PROCEDURE:  Procedure(s): DILATATION & CURETTAGE/HYSTEROSCOPY, VULVA BIOPSIES  SURGEON:  Emily Montgomery Emily Montgomery  ASSISTANTS: OR staff   ANESTHESIA:   general  ESTIMATED BLOOD LOSS: 20cc  BLOOD ADMINISTERED:none   FLUIDS: 1000ccLR  UOP: 100 cc drained with I&O cath at beginning of procedure  SPECIMEN:  Vulvar lesion x 2 in same vial.  Endometrial curettings  DISPOSITION OF SPECIMEN:  PATHOLOGY  FINDINGS: fibroid with significant submucosal component  DESCRIPTION OF OPERATION: Patient was taken to the operating room.  She is placed in the supine position. SCDs were on her lower extremities and functioning properly. General anesthesia with an LMA was administered without difficulty. Dr. Kalman Montgomery oversaw case.  Legs were then placed in the [place] in the low lithotomy position. The legs were lifted to the high lithotomy position.  Exam under anesthesia performed.  Globular uterus about 10-12 weeks in size and mobile noted.  Betadine prep was used on the inner thighs perineum and vagina x3. Patient was draped in a normal standard fashion. An in and out catheterization with a red rubber Foley catheter was performed. Approximately 100 cc of clear urine was noted.   Attention was first turned to two vulvar lesions that had a similar appearance.  Skin was anesthetized with 0.25% Marcaine.  These were both excised with #15 blade.  Bovie was used for excellent hemostasis.  These were sent for pathology together.    Then, a bivalve speculum was placed the vagina. The anterior lip of the cervix was grasped with single-tooth tenaculum.  A paracervical block of 1% lidocaine mixed one-to-one with epinephrine (1:100,000 units).  10 cc was used total. The  cervix is dilated up to #21 [facility] dilators. The endometrial cavity sounded to 8 cm.   A 2.9 millimeter diagnostic hysteroscope was obtained.  NS was used as a hysteroscopic fluid. The hysteroscope was advanced through the endocervical canal into the endometrial cavity. There was an area where at least one dilator was into the myometrium but there was no evidence of perforation and the fluid deficit was stable.  The endometrial cavity was easily advanced into with the hysteroscope.  However, once the hysteroscope was in the endometrial cavity, there was clear encroachment of the fibroid vs there being a large submucosal component of the fibroid.  This bled very easily if touched with the hysteroscope. The hysteroscope was removed and then a curetting with the #1 toothed curette was performed until a rough gritty texture was noted.  There was no clear polyp but some polypoid appearing endometrium.  With re visualization, this was removed.  TAt this point, the procedure was ended.  The hysteroscope was removed. The fluid deficit was 135 cc. The tenaculum was removed from the anterior lip of the cervix. The speculum was returned vagina. The prep was cleansed of the patient's skin. The legs are positioned back in the supine position. Sponge, lap, needle, initially counts were correct x2. Patient was taken to recovery in stable condition.  COUNTS:  YES  PLAN OF CARE: Transfer to PACU

## 2016-09-29 NOTE — Transfer of Care (Signed)
Immediate Anesthesia Transfer of Care Note  Patient: Emily Montgomery  Procedure(s) Performed: Procedure(s): DILATATION & CURETTAGE/HYSTEROSCOPY, VULVA BIOPSIES (N/A)  Patient Location: PACU  Anesthesia Type:General  Level of Consciousness: awake, alert  and oriented  Airway & Oxygen Therapy: Patient Spontanous Breathing and Patient connected to nasal cannula oxygen  Post-op Assessment: Report given to RN  Post vital signs: Reviewed and stable  Last Vitals: 130/92, 78, 14, 98% Vitals:   09/29/16 0701  BP: (!) 160/74  Pulse: 80  Resp: 16  Temp: 36.6 C    Last Pain:  Vitals:   09/29/16 0701  TempSrc: Oral         Complications: No apparent anesthesia complications

## 2016-09-29 NOTE — Anesthesia Procedure Notes (Signed)
Procedure Name: LMA Insertion Date/Time: 09/29/2016 9:21 AM Performed by: Bethena Roys T Pre-anesthesia Checklist: Patient identified, Emergency Drugs available, Suction available and Patient being monitored Patient Re-evaluated:Patient Re-evaluated prior to inductionOxygen Delivery Method: Circle system utilized Preoxygenation: Pre-oxygenation with 100% oxygen Intubation Type: IV induction Ventilation: Mask ventilation without difficulty LMA: LMA inserted LMA Size: 4.0 Number of attempts: 1 Airway Equipment and Method: Bite block Placement Confirmation: positive ETCO2 Tube secured with: Tape Dental Injury: Teeth and Oropharynx as per pre-operative assessment

## 2016-09-29 NOTE — Discharge Instructions (Addendum)
Post Anesthesia Home Care Instructions  Activity: Get plenty of rest for the remainder of the day. A responsible individual must stay with you for 24 hours following the procedure.  For the next 24 hours, DO NOT: -Drive a car -Paediatric nurse -Drink alcoholic beverages -Take any medication unless instructed by your physician -Make any legal decisions or sign important papers.  Meals: Start with liquid foods such as gelatin or soup. Progress to regular foods as tolerated. Avoid greasy, spicy, heavy foods. If nausea and/or vomiting occur, drink only clear liquids until the nausea and/or vomiting subsides. Call your physician if vomiting continues.  Special Instructions/Symptoms: Your throat may feel dry or sore from the anesthesia or the breathing tube placed in your throat during surgery. If this causes discomfort, gargle with warm salt water. The discomfort should disappear within 24 hours.  If you had a scopolamine patch placed behind your ear for the management of post- operative nausea and/or vomiting:  1. The medication in the patch is effective for 72 hours, after which it should be removed.  Wrap patch in a tissue and discard in the trash. Wash hands thoroughly with soap and water. 2. You may remove the patch earlier than 72 hours if you experience unpleasant side effects which may include dry mouth, dizziness or visual disturbances. 3. Avoid touching the patch. Wash your hands with soap and water after contact with the patch.   Post-surgical Instructions, Outpatient Surgery  You may expect to feel dizzy, weak, and drowsy for as long as 24 hours after receiving the medicine that made you sleep (anesthetic). For the first 24 hours after your surgery:    Do not drive a car, ride a bicycle, participate in physical activities, or take public transportation until you are done taking narcotic pain medicines or as directed by Dr. Sabra Heck.   Do not drink alcohol or take tranquilizers.     Do not take medicine that has not been prescribed by your physicians.   Do not sign important papers or make important decisions while on narcotic pain medicines.   Have a responsible person with you.   CARE OF INCISION  If you have a bandage, you may remove it in one day.  If there are steri-strips or dermabond, just let this loosen on its own.   You may shower on the first day after your surgery.  Do not sit in a tub bath for one week.  Avoid heavy lifting (more than 10 pounds/4.5 kilograms), pushing, or pulling.   Avoid activities that may risk injury to your incisions.   PAIN MANAGEMENT  Motrin 800mg .  (This is the same as 4-200mg  over the counter tablets of Motrin or ibuprofen.)  You may take this every eight hours or as needed for cramping.    Vicodin 5/500mg .  For more severe pain, take one or two tablets every four to six hours as needed for pain control.  (Remember that narcotic pain medications increase your risk of constipation.  If this becomes a problem, you may take an over the counter stool softener like Colace 100mg  up to four times a day.)  DO'S AND DON'T'S  Do not take a tub bath for one week.  You may shower on the first day after your surgery  Do not do any heavy lifting for one to two weeks.  This increases the chance of bleeding.  Do move around as you feel able.  Stairs are fine.  You may begin to exercise again as  you feel able.  Do not lift any weights for two weeks.  Do not put anything in the vagina for two weeks--no tampons, intercourse, or douching.    REGULAR MEDIATIONS/VITAMINS:  You may restart all of your regular medications as prescribed.  You may restart all of your vitamins as you normally take them.    PLEASE CALL OR SEEK MEDICAL CARE IF:  You have persistent nausea and vomiting.   You have trouble eating or drinking.   You have an oral temperature above 100.5.   You have constipation that is not helped by adjusting diet or  increasing fluid intake. Pain medicines are a common cause of constipation.   You have heavy vaginal bleeding  You have redness or drainage from your incision(s) or there is increasing pain or tenderness near or in the surgical site.

## 2016-10-02 ENCOUNTER — Encounter (HOSPITAL_BASED_OUTPATIENT_CLINIC_OR_DEPARTMENT_OTHER): Payer: Self-pay | Admitting: Obstetrics & Gynecology

## 2016-10-03 ENCOUNTER — Telehealth: Payer: Self-pay | Admitting: Obstetrics & Gynecology

## 2016-10-03 NOTE — Telephone Encounter (Signed)
Patient calling to see if her results are ready from surgery.

## 2016-10-03 NOTE — Telephone Encounter (Signed)
Routing to [place] for review and advise of pathology results from 09/29/2016.

## 2016-10-04 NOTE — Telephone Encounter (Signed)
Called and spoke with pt personally.  Pathology reviewed.  Procedure and results reviewed.  Pt aware fibroid is within and likely causing bleeding.  She is only spotting today.  No other issues.  Not taking pain medication.  Is considering hysterectomy.  Advised to wait and see if bleeding resolves before making this decision.  Will review at follow-up.  Questions answered.  Pt appreciative of phone call.  Ok to close encounter.

## 2016-10-09 NOTE — Anesthesia Postprocedure Evaluation (Signed)
Anesthesia Post Note  Patient: Jennilyn Esteve  Procedure(s) Performed: Procedure(s) (LRB): DILATATION & CURETTAGE/HYSTEROSCOPY, VULVA BIOPSIES (N/A)  Patient location during evaluation: PACU Anesthesia Type: General Level of consciousness: awake and alert Pain management: pain level controlled Vital Signs Assessment: post-procedure vital signs reviewed and stable Respiratory status: spontaneous breathing, nonlabored ventilation, respiratory function stable and patient connected to nasal cannula oxygen Cardiovascular status: blood pressure returned to baseline and stable Postop Assessment: no signs of nausea or vomiting Anesthetic complications: no       Last Vitals:  Vitals:   09/29/16 1015 09/29/16 1030  BP: (!) 144/85 (!) 158/79  Pulse: 78 67  Resp: 19 12  Temp:      Last Pain:  Vitals:   09/29/16 1115  TempSrc:   PainSc: 0-No pain                 Jex Strausbaugh S

## 2016-10-10 ENCOUNTER — Encounter: Payer: Self-pay | Admitting: Obstetrics & Gynecology

## 2016-10-10 ENCOUNTER — Ambulatory Visit (INDEPENDENT_AMBULATORY_CARE_PROVIDER_SITE_OTHER): Payer: BLUE CROSS/BLUE SHIELD | Admitting: Obstetrics & Gynecology

## 2016-10-10 VITALS — BP 142/92 | HR 76 | Resp 16 | Ht 62.5 in | Wt 133.0 lb

## 2016-10-10 DIAGNOSIS — N950 Postmenopausal bleeding: Secondary | ICD-10-CM

## 2016-10-10 DIAGNOSIS — N852 Hypertrophy of uterus: Secondary | ICD-10-CM

## 2016-10-10 NOTE — Progress Notes (Signed)
Post Operative Visit  Procedure: DILATATION & CURETTAGE/HYSTEROSCOPY, VULVA BIOPSIES (N/A Vagina )  Days Post-op: 11  Subjective: Doing well without complaints.  Stopped spotting two days ago.  Findings from procedure, pathology reviewed.  Pt has large 7.8 cm fibroid.  There was encroachment into cavity with some blood that was present at time of hysteroscopy.  Feel is bleeding continues, pt will need to consider myomectomy, [place], vs hysterectomy.  She is leaning towards hysterectomy.  Questions answered.  Denies vaginal discharge, fever, bowel or bladder change.  Objective: BP (!) 142/92 (BP Location: Right Arm, Patient Position: Sitting, Cuff Size: Normal)   Pulse 76   Resp 16   Ht 5' 2.5" (1.588 m)   Wt 133 lb (60.3 kg)   LMP 06/23/2015   BMI 23.94 kg/m   EXAM General: alert and cooperative Resp: clear to auscultation bilaterally Cardio: regular rate and rhythm GI: soft, non-tender; bowel sounds normal; no masses,  no organomegaly Extremities: extremities normal, atraumatic, no cyanosis or edema Vaginal Bleeding: none  Gyn:  NAEFG, vaginal pink and moist without lesions, cervix close, no CMT, 10-12 week sized uterus with bulk of uterus just behind and to left of bladder  Assessment: s/p hysteroscopy with D&C 7.8 cm uterine fibroid  Plan: Repeat PUS 3 months.  Pt knows to call with any continued bleeding.

## 2016-10-19 NOTE — Telephone Encounter (Signed)
Surgical procedure was completed on 09/29/16. Ok to close

## 2016-10-23 NOTE — Addendum Note (Signed)
Addendum  created 10/23/16 1436 by Myrtie Soman, MD   Sign clinical note

## 2016-10-23 NOTE — Anesthesia Postprocedure Evaluation (Signed)
Anesthesia Post Note  Patient: Emily Montgomery  Procedure(s) Performed: Procedure(s) (LRB): DILATATION & CURETTAGE/HYSTEROSCOPY, VULVA BIOPSIES (N/A)     Anesthesia Post Evaluation  Last Vitals:  Vitals:   09/29/16 1015 09/29/16 1030  BP: (!) 144/85 (!) 158/79  Pulse: 78 67  Resp: 19 12  Temp:      Last Pain:  Vitals:   09/29/16 1115  TempSrc:   PainSc: 0-No pain                 Levy Cedano S

## 2016-12-11 ENCOUNTER — Telehealth: Payer: Self-pay | Admitting: Obstetrics & Gynecology

## 2016-12-11 NOTE — Telephone Encounter (Signed)
Turn call to patient. Per ROI, can leave message on cell number. Left message that we are returning her call regarding scheduling. Call back at her convenience.

## 2016-12-11 NOTE — Telephone Encounter (Signed)
Patient wants to speak with Gay Filler about scheduling a surgery in September. She states she will be available in the morning to talk.

## 2016-12-12 NOTE — Telephone Encounter (Signed)
Patient returned Sally's call. °

## 2016-12-12 NOTE — Telephone Encounter (Signed)
Return call to patient. Left message to call back. 

## 2016-12-12 NOTE — Telephone Encounter (Signed)
Return call from patient. Desires to proceed with hysterectomy and removal of ovaries.  Would like to plan for September. Dates discussed. Would prefer 01-30-17. Brief review of recovery restrictions and return to work. Questions answered.  Has PUS/ 3 month recheck of uterine size scheduled for 01-04-17, advised will need to keep this appointment.  Will proceed with precert and call her back.  Ok to proceed with posting surgery? TLH/ BSO/ possible cysto?

## 2016-12-12 NOTE — Telephone Encounter (Signed)
I would like repeat PUS and ok to proceed with scheduling TLH/bilateral salpingectomy/possible BSO/cystoscopy.

## 2016-12-12 NOTE — Telephone Encounter (Signed)
Pelvic ultrasound scheduled for 01-04-17. Chart to business office for precert. Will close current encounter.

## 2016-12-14 ENCOUNTER — Telehealth: Payer: Self-pay | Admitting: Obstetrics & Gynecology

## 2016-12-14 NOTE — Telephone Encounter (Signed)
Call placed to patient review benefits for a recommended surgical procedure. Left voicemail requesting a return call.   cc: Lamont Snowball, RN

## 2016-12-19 NOTE — Telephone Encounter (Signed)
Patient returned call. Reviewed benefit for recommended surgery. Patient understood and agreeable. Patient has confirmed and is ready to proceed with scheduling. Patient aware this is professional benefit only. Patient aware will be contacted by hospital for separate benefits. Routing to nurse supervisor for scheduling.  Routing to Lamont Snowball, RN

## 2016-12-25 NOTE — Telephone Encounter (Signed)
Call to patient to confirm surgery date of 01-30-17 at 0730 at [facility]. Call also to review surgery instructions and make surgery related appointments. Left message to call back.

## 2017-01-01 ENCOUNTER — Telehealth: Payer: Self-pay | Admitting: Obstetrics & Gynecology

## 2017-01-01 ENCOUNTER — Other Ambulatory Visit: Payer: Self-pay | Admitting: *Deleted

## 2017-01-01 DIAGNOSIS — N852 Hypertrophy of uterus: Secondary | ICD-10-CM

## 2017-01-01 DIAGNOSIS — N950 Postmenopausal bleeding: Secondary | ICD-10-CM

## 2017-01-01 NOTE — Telephone Encounter (Signed)
Call to patient to review benefit for ultrasound scheduled 01-04-17. Left voicemail to return call.

## 2017-01-03 NOTE — Telephone Encounter (Signed)
Patient returned your call.  States it is ok to leave detailed message if she does not answer.

## 2017-01-03 NOTE — Telephone Encounter (Signed)
Return call to patient. As requested, left voicemail with benefits.   Will close encounter.

## 2017-01-04 ENCOUNTER — Encounter: Payer: Self-pay | Admitting: Obstetrics & Gynecology

## 2017-01-04 ENCOUNTER — Ambulatory Visit (INDEPENDENT_AMBULATORY_CARE_PROVIDER_SITE_OTHER): Payer: BLUE CROSS/BLUE SHIELD | Admitting: Obstetrics & Gynecology

## 2017-01-04 ENCOUNTER — Ambulatory Visit (INDEPENDENT_AMBULATORY_CARE_PROVIDER_SITE_OTHER): Payer: BLUE CROSS/BLUE SHIELD

## 2017-01-04 VITALS — BP 112/80 | HR 58 | Resp 14 | Ht 62.5 in | Wt 131.0 lb

## 2017-01-04 DIAGNOSIS — D251 Intramural leiomyoma of uterus: Secondary | ICD-10-CM

## 2017-01-04 DIAGNOSIS — N852 Hypertrophy of uterus: Secondary | ICD-10-CM

## 2017-01-04 DIAGNOSIS — N950 Postmenopausal bleeding: Secondary | ICD-10-CM | POA: Diagnosis not present

## 2017-01-04 DIAGNOSIS — Z01812 Encounter for preprocedural laboratory examination: Secondary | ICD-10-CM

## 2017-01-04 DIAGNOSIS — R8290 Unspecified abnormal findings in urine: Secondary | ICD-10-CM

## 2017-01-04 LAB — POCT URINALYSIS DIPSTICK
BILIRUBIN UA: NEGATIVE
Glucose, UA: NEGATIVE
Ketones, UA: NEGATIVE
LEUKOCYTES UA: NEGATIVE
NITRITE UA: NEGATIVE
PH UA: 5 (ref 5.0–8.0)
PROTEIN UA: NEGATIVE
Urobilinogen, UA: 0.2 E.U./dL

## 2017-01-04 NOTE — Telephone Encounter (Signed)
Patient in office today for pelvic ultrasound. Discussed surgery date of 01-30-17. Per discussion with Dr Sabra Heck, scheduled for consult with Dr Quincy Simmonds on Monday 01-08-17 regarding SUI. Patient is aware this may impact surgery date and she is agreeable to this possible change. Discussed possible urodynamic testing and brief review of procedure.  Routing to provider for final review. Patient agreeable to disposition. Will close encounter.

## 2017-01-04 NOTE — Progress Notes (Deleted)
GYNECOLOGY  VISIT   HPI: 56 y.o.   Married  Caucasian  female   G2P2002 with [name] was 06/23/2015.   here for     GYNECOLOGIC HISTORY: [name] was 06/23/2015. Contraception:  Postmenopausal Menopausal hormone therapy:  *** Last mammogram:  Last pap smear:   ***        OB History    Gravida Para Term Preterm AB Living   2 2 2     2    SAB TAB Ectopic Multiple Live Births           2         Patient Active Problem List   Diagnosis Date Noted  . Multinodular goiter 09/25/2014    Past Medical History:  Diagnosis Date  . Family history of adverse reaction to anesthesia    sister- ponv  . Multinodular thyroid    goiter--- per pt consulted w/ endocrinologist, told benign cyst  . PMB (postmenopausal bleeding)   . Thickened endometrium    and endometrial irregularity    Past Surgical History:  Procedure Laterality Date  . COLONOSCOPY WITH PROPOFOL  2015  . DILATATION & CURETTAGE/HYSTEROSCOPY WITH MYOSURE N/A 09/29/2016   Procedure: DILATATION & CURETTAGE/HYSTEROSCOPY, VULVA BIOPSIES;  Surgeon: Megan Salon, MD;  Location: [place];  Service: Gynecology;  Laterality: N/A;  . TONSILLECTOMY AND ADENOIDECTOMY  child  . WISDOM TOOTH EXTRACTION  20 approx.    Current Outpatient Prescriptions  Medication Sig Dispense Refill  . BIOTIN PO Take by mouth daily.     . Cyanocobalamin (VITAMIN B 12 PO) Take by mouth daily.     Marland Kitchen ibuprofen (ADVIL,MOTRIN) 200 MG tablet Take 200 mg by mouth as needed.    . Omega-3 Fatty Acids (FISH OIL PO) Take by mouth daily.      No current facility-administered medications for this visit.      ALLERGIES: Patient has no known allergies.  Family History  Problem Relation Age of Onset  . Lung disease Father   . Hypertension Mother   . Cancer Sister        hodgekins  . Thyroid cancer Paternal Grandmother   . Thyroid disease Neg Hx     Social History   Social History  .  Marital status: Married    Spouse name: N/A  . Number of children: N/A  . Years of education: N/A   Occupational History  . Not on file.   Social History Main Topics  . Smoking status: Never Smoker  . Smokeless tobacco: Never Used  . Alcohol use 1.2 - 2.4 oz/week    2 - 4 Standard drinks or equivalent per week     Comment: occasional  . Drug use: No  . Sexual activity: Yes    Partners: Male    Birth control/ protection: Post-menopausal   Other Topics Concern  . Not on file   Social History Narrative  . No narrative on file    ROS:  Pertinent items are noted in HPI.  PHYSICAL EXAMINATION:    LMP 06/23/2015     General appearance: alert, cooperative and appears stated age Head: Normocephalic, without obvious abnormality, atraumatic Neck: no adenopathy, supple, symmetrical, trachea midline and thyroid normal to inspection and palpation Lungs: clear to auscultation bilaterally Breasts: normal appearance, no masses or tenderness, No nipple retraction or dimpling, No nipple discharge or bleeding, No axillary or supraclavicular adenopathy Heart: regular rate and rhythm Abdomen: soft, non-tender, no masses,  no organomegaly Extremities: extremities normal, atraumatic, no cyanosis or edema Skin: Skin color, texture, turgor normal. No rashes or lesions Lymph nodes: Cervical, supraclavicular, and axillary nodes normal. No abnormal inguinal nodes palpated Neurologic: Grossly normal  Pelvic: External genitalia:  no lesions              Urethra:  normal appearing urethra with no masses, tenderness or lesions              Bartholins and Skenes: normal                 Vagina: normal appearing vagina with normal color and discharge, no lesions              Cervix: no lesions                Bimanual Exam:  Uterus:  normal size, contour, position, consistency, mobility, non-tender              Adnexa: no mass, fullness, tenderness              Rectal exam: {yes no:314532}.   Confirms.              Anus:  normal sphincter tone, no lesions  Chaperone was present for exam.  ASSESSMENT     PLAN     An After Visit Summary was printed and given to the patient.  ______ minutes face to face time of which over 50% was spent in counseling.

## 2017-01-04 NOTE — Progress Notes (Signed)
56 y.o. [id number] MarriedCaucasian female here for repeat ultrasound after having postmonopausal bleeding earlier this year.  Evaluation done for this showed an almost 8cm fibroid.  Hysteroscopy was performed.  Pathology was negative.  Pt has not had additional bleeding but with ultrasound in May, she became more aware of pressure, fullness and bladder pressure that she now realizes are likely related to her fibroid.    Due to size of fibroid, repeat ultrasound was recommended.  Pt is here for this today but to also discuss definitive surgery.  She feels the above symptoms will all improve or resolve with hysterectomy.  She reports today that she is having increased issues with urinary incontinence.  She is wearing a pad every day and leaks with laughing, bending over, cough, and exercise.  She wonders how much of this will improve with hysterectomy.  Some may due to the size her her fibroid and uterus but I feel evaluation is prudent so she can decide if she wants to proceed with surgical treatment at the same time.  Possible causes, evaluation and treatment options reviewed.  She would like to proceed with evaluation.  From hysterectomy standpoint, procedure discussed with patient.  Hospital stay, recovery and pain management all discussed.  Risks discussed including but not limited to bleeding, 1% risk of receiving a  transfusion, infection, 3-4% risk of bowel/bladder/ureteral/vascular injury discussed as well as possible need for additional surgery if injury does occur discussed.  DVT/PE and rare risk of death discussed.  My actual complications with prior surgeries discussed.  Vaginal cuff dehiscence discussed.  Hernia formation discussed.  Positioning and incision locations discussed.  Patient aware if pathology abnormal she may need additional treatment.  All questions answered.    Ultrasound today showed uterus 10.2 x 7.8 x 7.0cm with 7.3 x 5.5 x 7.5cm.  No significant change from prior ultrasound.  No  growth noted.  Endometrium is 4.68mm.  Left ovary 0.9 x 0.8 x 0.7cm and right ovary is 1.5 x 1.2 x 1.1cm.  No free fluid noted.  Ob Hx:   [name] was 06/23/2015.   Also had some bleeding in late May and early June for about two weeks.       Sexually active: Yes.   Birth control: PMP Last pap: 4/18 Last MMG: 10/28/15 Tobacco: none  Past Surgical History:  Procedure Laterality Date  . COLONOSCOPY WITH PROPOFOL  2015  . DILATATION & CURETTAGE/HYSTEROSCOPY WITH MYOSURE N/A 09/29/2016   Procedure: DILATATION & CURETTAGE/HYSTEROSCOPY, VULVA BIOPSIES;  Surgeon: Megan Salon, MD;  Location: [place];  Service: Gynecology;  Laterality: N/A;  . TONSILLECTOMY AND ADENOIDECTOMY  child  . WISDOM TOOTH EXTRACTION  20 approx.    Past Medical History:  Diagnosis Date  . Family history of adverse reaction to anesthesia    sister- ponv  . Multinodular thyroid    goiter--- per pt consulted w/ endocrinologist, told benign cyst  . PMB (postmenopausal bleeding)   . Thickened endometrium    and endometrial irregularity    Allergies: Patient has no known allergies.  Current Outpatient Prescriptions  Medication Sig Dispense Refill  . BIOTIN PO Take by mouth daily.     . Cyanocobalamin (VITAMIN B 12 PO) Take by mouth daily.     Marland Kitchen ibuprofen (ADVIL,MOTRIN) 200 MG tablet Take 200 mg by mouth as needed.    . Omega-3 Fatty Acids (FISH OIL PO) Take by mouth daily.      No current facility-administered medications for  this visit.     ROS: Pertinent items noted in HPI and remainder of comprehensive ROS otherwise negative.  Exam:    BP 112/80 (BP Location: Right Arm, Patient Position: Sitting, Cuff Size: Normal)   Pulse (!) 58   Resp 14   Ht 5' 2.5" (1.588 m)   Wt 131 lb (59.4 kg)   LMP 06/23/2015   BMI 23.58 kg/m   General appearance: alert and cooperative Head: Normocephalic, without obvious abnormality, atraumatic Neck: no adenopathy, supple,  symmetrical, trachea midline and thyroid not enlarged, symmetric, no tenderness/mass/nodules Lungs: clear to auscultation bilaterally Heart: regular rate and rhythm, S1, S2 normal, no murmur, click, rub or gallop Abdomen: soft, non-tender; bowel sounds normal; no masses,  no organomegaly Extremities: extremities normal, atraumatic, no cyanosis or edema Skin: Skin color, texture, turgor normal. No rashes or lesions Lymph nodes: Cervical, supraclavicular, and axillary nodes normal. no inguinal nodes palpated Neurologic: Grossly normal  Pelvic: External genitalia:  no lesions              Urethra: normal appearing urethra with no masses, tenderness or lesions              Bartholins and Skenes: normal                 Vagina: normal appearing vagina with normal color and discharge, no lesions              Cervix: normal appearance              Pap taken: No.        Bimanual Exam:  Uterus:  uterus is normal size, shape, consistency and nontender, enlarged to 10-12 week's size                                      Adnexa:    normal adnexa in size, nontender and no masses                                      Rectovaginal: Deferred                                      Anus:  no lesions  A: Symptomatic uterine fibroids with desire for definitive treatment Stress urinary incontinence  P:  Will have pt seen by Dr. Quincy Simmonds and likely proceed with urodynamics.  Once evaluation is completed, will see if combined procedure is appropriate and proceed with additional planning.  Pt comfortable with plan. Urine micro and culture pending.  ~25 minutes spent with patient >50% of time was in face to face discussion of above.

## 2017-01-04 NOTE — Progress Notes (Deleted)
56 y.o. [id number] MarriedCaucasian female here for discussion of upcoming procedure. HYSTERECTOMY TOTAL LAPAROSCOPIC WITH SALPINGECTOMY, LAPAROSCOPIC BILATERAL SALPINGO OOPHORECTOMY, General Possible,   CYSTOSCOPY possible      planned due to ***.  Pre-op evaluation thus far has included ***.     Ob Hx:   [name] was 06/23/2015.          Sexually active: Yes.   Birth control: post menopausal status Last pap: 09/15/16 negative, HR HPV negative, 09/10/14 negative   Last MMG: 10/28/15  Tobacco: Never smoker  Past Surgical History:  Procedure Laterality Date  . COLONOSCOPY WITH PROPOFOL  2015  . DILATATION & CURETTAGE/HYSTEROSCOPY WITH MYOSURE N/A 09/29/2016   Procedure: DILATATION & CURETTAGE/HYSTEROSCOPY, VULVA BIOPSIES;  Surgeon: Megan Salon, MD;  Location: [place];  Service: Gynecology;  Laterality: N/A;  . TONSILLECTOMY AND ADENOIDECTOMY  child  . WISDOM TOOTH EXTRACTION  20 approx.    Past Medical History:  Diagnosis Date  . Family history of adverse reaction to anesthesia    sister- ponv  . Multinodular thyroid    goiter--- per pt consulted w/ endocrinologist, told benign cyst  . PMB (postmenopausal bleeding)   . Thickened endometrium    and endometrial irregularity    Allergies: Patient has no known allergies.  Current Outpatient Prescriptions  Medication Sig Dispense Refill  . BIOTIN PO Take by mouth daily.     . Cyanocobalamin (VITAMIN B 12 PO) Take by mouth daily.     Marland Kitchen ibuprofen (ADVIL,MOTRIN) 200 MG tablet Take 200 mg by mouth as needed.    . Omega-3 Fatty Acids (FISH OIL PO) Take by mouth daily.      No current facility-administered medications for this visit.     ROS: {Ros - complete:30496}  Exam:    BP 112/80 (BP Location: Right Arm, Patient Position: Sitting, Cuff Size: Normal)   Pulse (!) 58   Resp 14   Ht 5' 2.5" (1.588 m)   Wt 131 lb (59.4 kg)   LMP 06/23/2015   BMI 23.58 kg/m   General appearance: alert  and cooperative Head: Normocephalic, without obvious abnormality, atraumatic Neck: no adenopathy, supple, symmetrical, trachea midline and thyroid not enlarged, symmetric, no tenderness/mass/nodules Lungs: clear to auscultation bilaterally Heart: regular rate and rhythm, S1, S2 normal, no murmur, click, rub or gallop Abdomen: soft, non-tender; bowel sounds normal; no masses,  no organomegaly Extremities: extremities normal, atraumatic, no cyanosis or edema Skin: Skin color, texture, turgor normal. No rashes or lesions Lymph nodes: Cervical, supraclavicular, and axillary nodes normal. no inguinal nodes palpated Neurologic: Grossly normal  Pelvic: External genitalia:  no lesions              Urethra: normal appearing urethra with no masses, tenderness or lesions              Bartholins and Skenes: {EXAM; GYN CWCBJ:62831}                 Vagina: {vagina:315903::"normal appearing vagina with normal color and discharge, no lesions"}              Cervix: {exam; gyn cervix:30847}              Pap taken: {yes no:314532}        Bimanual Exam:  Uterus:  {uterus:315905::"uterus is normal size, shape, consistency and nontender"}  Adnexa:    {ZWRKYB:533917::"HEB indicated"}                                      Rectovaginal: Deferred                                      Anus:  {Exam; anus:16940}  A: {CCO Gynecologic Problems:21020265}     P:  *** planned Rx for Motrin and Percocet given. Medications/Vitamins reviewed.  Pt knows needs to stop ***. Hysterectomy brochure given for pre and post op instructions.

## 2017-01-05 ENCOUNTER — Ambulatory Visit: Payer: BLUE CROSS/BLUE SHIELD | Admitting: Obstetrics and Gynecology

## 2017-01-05 ENCOUNTER — Other Ambulatory Visit: Payer: Self-pay | Admitting: Obstetrics & Gynecology

## 2017-01-05 LAB — URINALYSIS, MICROSCOPIC ONLY
Casts: NONE SEEN /lpf
WBC, UA: NONE SEEN /hpf (ref 0–?)

## 2017-01-05 LAB — URINE CULTURE

## 2017-01-08 ENCOUNTER — Ambulatory Visit: Payer: BLUE CROSS/BLUE SHIELD | Admitting: Obstetrics and Gynecology

## 2017-01-08 ENCOUNTER — Encounter: Payer: Self-pay | Admitting: Obstetrics and Gynecology

## 2017-01-08 ENCOUNTER — Telehealth: Payer: Self-pay | Admitting: Obstetrics and Gynecology

## 2017-01-08 ENCOUNTER — Ambulatory Visit (INDEPENDENT_AMBULATORY_CARE_PROVIDER_SITE_OTHER): Payer: BLUE CROSS/BLUE SHIELD | Admitting: Obstetrics and Gynecology

## 2017-01-08 VITALS — BP 112/70 | HR 68 | Resp 16 | Wt 131.0 lb

## 2017-01-08 DIAGNOSIS — D259 Leiomyoma of uterus, unspecified: Secondary | ICD-10-CM | POA: Diagnosis not present

## 2017-01-08 DIAGNOSIS — N393 Stress incontinence (female) (male): Secondary | ICD-10-CM | POA: Diagnosis not present

## 2017-01-08 NOTE — Telephone Encounter (Signed)
Spoke with patient. Scheduled for urodynamics testing on 8/22 and hysterectomy on 9/11.  Patient asking if she decides to have hysterectomy only, "no bladder sling", will she have to repeat urodynamics testing again later if she decides to wait and see if fibroid resolves the stress incontinence?  Is the urodynamics needed now if she decides to have hysterectomy first and see if stress incontinence resolves?  Patient states she will discuss in greater detail on Friday with Dr. Quincy Simmonds, would like to know prior so that she can review her options.   Advised patient would review with Dr. Quincy Simmonds and return call with recommendations, patient is agreeable.   Dr. Quincy Simmonds -please advise?

## 2017-01-08 NOTE — Progress Notes (Signed)
GYNECOLOGY  VISIT   HPI: 56 y.o.   Married  Caucasian  female   G2P2002 with [name] was 06/23/2015.   here for evaluation of urinary incontinence for the last year.   Planning hysterectomy for uterine fibroid with Dr. Sabra Heck.  Wearing a pad every day for urinary incontinence. Did have an episode of complete loss of control when walking fast.  Also can leak with a cough or exercise.   Voids well.   No spontaneous leakage.   DF - every 3 hours.  NF - once. No enuresis.   Hx UTI in June.   No prior pelvic surgery.   No constipation or fecal incontinence.  No splinting.  Feels pressure to void when she has intercourse.  Is very active physically to help at home. Does outside work.  2 vaginal deliveries.  Large 7 pounds, 2 ounces.   GYNECOLOGIC HISTORY: [name] was 06/23/2015. Contraception:  Postmenopausal Menopausal hormone therapy: none Last mammogram: 10-20-15 Density C/Lt.Br.with calcification and rec.further studies;Rt.Br.neg. 10-28-15 Lt.Diag.reveals 61mm group of benign milk calcium in upper outer Lt.Br./Neg/screening 43yr:[facility] Last pap smear:  09-15-16 Neg:Neg HR HPV                              09-10-14 Neg        OB History    Gravida Para Term Preterm AB Living   2 2 2     2    SAB TAB Ectopic Multiple Live Births           2         Patient Active Problem List   Diagnosis Date Noted  . Multinodular goiter 09/25/2014    Past Medical History:  Diagnosis Date  . Family history of adverse reaction to anesthesia    sister- ponv  . Multinodular thyroid    goiter--- per pt consulted w/ endocrinologist, told benign cyst  . PMB (postmenopausal bleeding)   . Thickened endometrium    and endometrial irregularity    Past Surgical History:  Procedure Laterality Date  . COLONOSCOPY WITH PROPOFOL  2015  . DILATATION & CURETTAGE/HYSTEROSCOPY WITH MYOSURE N/A 09/29/2016   Procedure:  DILATATION & CURETTAGE/HYSTEROSCOPY, VULVA BIOPSIES;  Surgeon: Megan Salon, MD;  Location: [place];  Service: Gynecology;  Laterality: N/A;  . TONSILLECTOMY AND ADENOIDECTOMY  child  . WISDOM TOOTH EXTRACTION  20 approx.    Current Outpatient Prescriptions  Medication Sig Dispense Refill  . BIOTIN PO Take by mouth daily.     . Cyanocobalamin (VITAMIN B 12 PO) Take by mouth daily.     Marland Kitchen ibuprofen (ADVIL,MOTRIN) 200 MG tablet Take 200 mg by mouth as needed.    . Omega-3 Fatty Acids (FISH OIL PO) Take by mouth daily.      No current facility-administered medications for this visit.      ALLERGIES: Patient has no known allergies.  Family History  Problem Relation Age of Onset  . Lung disease Father   . Hypertension Mother   . Cancer Sister        hodgekins  . Thyroid cancer Paternal Grandmother   . Thyroid disease Neg Hx     Social History   Social History  . Marital status: Married    Spouse name: N/A  . Number of children: N/A  . Years of education: N/A   Occupational History  . Not on file.  Social History Main Topics  . Smoking status: Never Smoker  . Smokeless tobacco: Never Used  . Alcohol use 1.2 - 2.4 oz/week    2 - 4 Standard drinks or equivalent per week     Comment: occasional  . Drug use: No  . Sexual activity: Yes    Partners: Male    Birth control/ protection: Post-menopausal   Other Topics Concern  . Not on file   Social History Narrative  . No narrative on file    ROS:  Pertinent items are noted in HPI.  PHYSICAL EXAMINATION:    BP 112/70 (BP Location: Right Arm, Patient Position: Sitting, Cuff Size: Normal)   Pulse 68   Resp 16   Wt 131 lb (59.4 kg)   LMP 06/23/2015   BMI 23.58 kg/m     General appearance: alert, cooperative and appears stated age Lungs: clear to auscultation bilaterally Heart: regular rate and rhythm Abdomen: soft, non-tender, no masses,  no organomegaly  Pelvic: External genitalia:  no  lesions              Urethra:  normal appearing urethra with no masses, tenderness or lesions              Bartholins and Skenes: normal                 Vagina: normal appearing vagina with normal color and discharge, no lesions              Cervix: no lesions                Bimanual Exam:  Uterus:  7 - 8 cm uterine fibroid sitting right on the bladder.               Adnexa: no mass, fullness, tenderness              Rectal exam: Yes.  .  Confirms.              Anus:  normal sphincter tone, no lesions  Chaperone was present for exam.  ASSESSMENT  Stress incontinence.  Uterine fibroid.  PLAN  Discussed stress incontinence - risk factors and treatment options. I told the patient it is possible that her fibroid may or may not be contributing to her stress incontinence. She understands that she could have resolution of her urinary incontinence following hysterectomy even if she does not have a midurethral sling performed, but that I am not able to predict her outcome at this time. We discussed permanent mesh materials which are currently the most widely used for midurethral sling.   Slings are generally 85 - 90% effective in treatment of stress incontinence.  I discussed risks of slings including but not limited to erosions and exposure, dyspareunia, cystotomy, urinary retention and slower voiding, increase in urgency symptoms, need for prolonged catheterization or self catheterization, urinary tract infections, bleeding, infection, damage to surrounding organs, reaction to anesthesia, DVT, PE, death, need for reoperation, and recurrence of incontinence.  I recommend urodynamic testing if patient wishes to proceed with incontinence surgical care.  She is already scheduled for this this week.   An After Visit Summary was printed and given to the patient.  ___40___ minutes face to face time of which over 50% was spent in counseling.

## 2017-01-08 NOTE — Telephone Encounter (Signed)
Patient has some questions for Dr.Silva before having her procedure this week.

## 2017-01-08 NOTE — Telephone Encounter (Signed)
The only reason to do the urodynamics at this time, is if she wants to proceed with the midurethral sling to treat stress urinary incontinence at the time of her hysterectomy.  If she does not want to proceed with surgery for bladder control now, this is not currently recommended.  Her bladder functioning may be different once the uterus with fibroid is removed. Therefore, I would recommend repeating urodynamic testing in the future after hysterectomy if she does not have the midurethral sling now.  I hope this clarifies.

## 2017-01-09 NOTE — Telephone Encounter (Signed)
Left message to call Cyara Devoto at [phone].  

## 2017-01-09 NOTE — Telephone Encounter (Addendum)
Spoke with patient, advised as seen below per Dr. Quincy Simmonds. Patient has additional questions, recommended OV prior to urodynamics testing for further discussion, patient declined d/t work schedule.  1. What percentage, if known, of stress incontinence is resolved by removal of fibroid?  2. Can urodynamics determine "degree or severity" of stress incontinence?   3. How soon after hysterectomy can midurethral sling surgery be done if removal of fibroid does not resolve stress incontinence? By the end of year?  Again, recommended OV for further discussion, offered OV prior to urodynamics testing, patient declined. Advised patient would review with Dr. Quincy Simmonds and return call, patient agreeable.   Dr. Quincy Simmonds- please review and advise?

## 2017-01-09 NOTE — Telephone Encounter (Signed)
Dr. Quincy Simmonds -see patient message below, will close encounter.  Routing to provider for final review. Patient is agreeable to disposition. Will close encounter.

## 2017-01-09 NOTE — Telephone Encounter (Signed)
Patient has decided that she would like to proceed with urodynamics testing tomorrow. No need to return patient call.

## 2017-01-10 ENCOUNTER — Ambulatory Visit: Payer: BLUE CROSS/BLUE SHIELD

## 2017-01-10 ENCOUNTER — Telehealth: Payer: Self-pay

## 2017-01-10 NOTE — Telephone Encounter (Signed)
Message left to return call to [facility] at [phone].   3 o'clock urodynamics appointment needs to be cancelled.

## 2017-01-10 NOTE — Telephone Encounter (Signed)
Spoke with patient. Advised per Dr.Silva simple uro testing can be performed on Friday. If patient would like more in depth testing with urodynamics machine we will be happy to do this once our machine has been serviced. Patient would prefer to have testing with Dr.Silva on Friday. Patient is currently scheduled for 01/12/2017 at 10:30 am with Dr.Silva.  Routing to Dr.Silva for review.

## 2017-01-10 NOTE — Telephone Encounter (Signed)
Left message to call [place] at [phone].  Need to cancel patient's urodynamics testing for today at 3 pm due to the need for service on the machine. Once a service date is scheduled our office will contact the patient regarding rescheduling.

## 2017-01-10 NOTE — Telephone Encounter (Signed)
Patient returned call and is aware her appointment for today has been cancelled for urodynamics. She will await a call back to reschedule.

## 2017-01-10 NOTE — Telephone Encounter (Signed)
Thank you for facilitating this.  I have closed the encounter.

## 2017-01-12 ENCOUNTER — Encounter: Payer: Self-pay | Admitting: Obstetrics and Gynecology

## 2017-01-12 ENCOUNTER — Ambulatory Visit (INDEPENDENT_AMBULATORY_CARE_PROVIDER_SITE_OTHER): Payer: BLUE CROSS/BLUE SHIELD | Admitting: Obstetrics and Gynecology

## 2017-01-12 VITALS — BP 120/76 | HR 64 | Ht 62.5 in | Wt 131.0 lb

## 2017-01-12 DIAGNOSIS — N393 Stress incontinence (female) (male): Secondary | ICD-10-CM

## 2017-01-12 NOTE — Progress Notes (Signed)
GYNECOLOGY  VISIT   HPI: 56 y.o.   Married  Caucasian  female   G2P2002 with [name] was 06/23/2015.   here for simple cystometrics.   GYNECOLOGIC HISTORY: [name] was 06/23/2015. Contraception:  Postmenopausal Menopausal hormone therapy:  none Last mammogram:  10-20-15 Density C/Lt.Br.with calcification and rec.further studies;Rt.Br.neg. 10-28-15 Lt.Diag.reveals 45mm group of benign milk calcium in upper outer Lt.Br./Neg/screening 64yr:[facility] Last pap smear:  09-15-16 Neg:Neg HR HPV                              09-10-14 Neg         OB History    Gravida Para Term Preterm AB Living   2 2 2  0 0 2   SAB TAB Ectopic Multiple Live Births   0 0 0 0 2         Patient Active Problem List   Diagnosis Date Noted  . Multinodular goiter 09/25/2014    Past Medical History:  Diagnosis Date  . Family history of adverse reaction to anesthesia    sister- ponv  . Multinodular thyroid    goiter--- per pt consulted w/ endocrinologist, told benign cyst  . PMB (postmenopausal bleeding)   . Thickened endometrium    and endometrial irregularity    Past Surgical History:  Procedure Laterality Date  . COLONOSCOPY WITH PROPOFOL  2015  . DILATATION & CURETTAGE/HYSTEROSCOPY WITH MYOSURE N/A 09/29/2016   Procedure: DILATATION & CURETTAGE/HYSTEROSCOPY, VULVA BIOPSIES;  Surgeon: Megan Salon, MD;  Location: [place];  Service: Gynecology;  Laterality: N/A;  . TONSILLECTOMY AND ADENOIDECTOMY  child  . WISDOM TOOTH EXTRACTION  20 approx.    Current Outpatient Prescriptions  Medication Sig Dispense Refill  . BIOTIN PO Take by mouth daily.     . Cyanocobalamin (VITAMIN B 12 PO) Take by mouth daily.     Marland Kitchen ibuprofen (ADVIL,MOTRIN) 200 MG tablet Take 200 mg by mouth as needed.    . Omega-3 Fatty Acids (FISH OIL PO) Take by mouth daily.      No current facility-administered medications for this visit.       ALLERGIES: Patient has no known allergies.  Family History  Problem Relation Age of Onset  . Lung disease Father   . Hypertension Mother   . Cancer Sister        hodgekins  . Thyroid cancer Paternal Grandmother   . Thyroid disease Neg Hx     Social History   Social History  . Marital status: Married    Spouse name: N/A  . Number of children: N/A  . Years of education: N/A   Occupational History  . Not on file.   Social History Main Topics  . Smoking status: Never Smoker  . Smokeless tobacco: Never Used  . Alcohol use 1.2 - 2.4 oz/week    2 - 4 Standard drinks or equivalent per week     Comment: occasional  . Drug use: No  . Sexual activity: Yes    Partners: Male    Birth control/ protection: Post-menopausal   Other Topics Concern  . Not on file   Social History Narrative  . No narrative on file    ROS:  Pertinent items are noted in HPI.  PHYSICAL EXAMINATION:    BP 120/76 (BP Location: Right Arm, Patient Position: Sitting, Cuff Size: Normal)   Pulse 64   Ht 5' 2.5" (  1.588 m)   Wt 131 lb (59.4 kg)   LMP 06/23/2015   BMI 23.58 kg/m     General appearance: alert, cooperative and appears stated age   Simple cystometrics Verbal consent for procedure.  Voided 200 cc spontaneously.  Sterile prep with Hibiclens. Red rubber cath - PVR drops only.  1st sensation 120 cc, second sensation 165 cc, 3rd sensation 325 cc.  Stable bladder.  GSI noted at max capacity with cough.  Chaperone was present for exam.  ASSESSMENT  Stress incontinence.  Uterine fibroid.  PLAN  Discussed midurethral sling again risks and benefits.  Will proceed with midurethral sling at the time of laparoscopic hysterectomy.  Will need to precert and move surgical date.    An After Visit Summary was printed and given to the patient.  __15____ minutes face to face time of which over 50% was spent in counseling.

## 2017-01-12 NOTE — Patient Instructions (Signed)
Please call for painful urination, blood in the urine, or fever.

## 2017-01-16 ENCOUNTER — Telehealth: Payer: Self-pay | Admitting: Obstetrics and Gynecology

## 2017-01-16 NOTE — Telephone Encounter (Signed)
Return call to patient. Per ROI can leave message on voice mail. Left message to call back regarding rescheduling procedure. Still holding 02-12-17 as possible date to reschedule to.

## 2017-01-16 NOTE — Telephone Encounter (Signed)
Patient called to reschedule her surgery that was recently cancelled.   Cc: Emily Montgomery

## 2017-01-17 NOTE — Telephone Encounter (Signed)
Return call from patient. Confirms desire to reschedule surgery to 02-12-17. Patient does not have time to review surgery instructions at this time. Patient will reschedule surgery and patient to call back to review surgery instructions.    Routing to provider for final review. Patient agreeable to disposition. Will close encounter.

## 2017-01-17 NOTE — Telephone Encounter (Signed)
Patient returning your call about rescheduling her surgery.

## 2017-01-17 NOTE — Telephone Encounter (Signed)
Return call to patient. Left message to call back. 

## 2017-01-18 ENCOUNTER — Telehealth: Payer: Self-pay | Admitting: Obstetrics and Gynecology

## 2017-01-18 NOTE — Telephone Encounter (Signed)
Will need copy of her visit to urgent care if she presents there for care as she is having upcoming surgery.  Cc- Dr. Sabra Heck, [place].

## 2017-01-18 NOTE — Telephone Encounter (Signed)
Patient called in stating she is experiencing UTI symptoms after urodynamics last week. Offered patient appointment today which she declined. Patents states she wishes to be seen at local urgent care if symptoms persists.   Routing to provider for final review. Patient agreeable to disposition. Will close encounter.

## 2017-01-18 NOTE — Telephone Encounter (Addendum)
Patient called in to let the doctor know she went to an urgent care today for a possible urinary tract infection and the results came back negative. She said she is also happy to report her symptoms are better. FYI only. Sending to provider for review and closing encounter.  Please see telephone note from earlier today for reference, if needed.

## 2017-01-18 NOTE — Telephone Encounter (Signed)
Patient is going to have urgent care send over records from her visit today regarding possible UTI.   Routing to provider for final review. Patient agreeable to disposition. Will close encounter.

## 2017-01-21 ENCOUNTER — Telehealth: Payer: Self-pay | Admitting: Obstetrics and Gynecology

## 2017-01-21 NOTE — Telephone Encounter (Signed)
Please contact patient in follow up to her visit to Urgent Care at [facility] for possible UTI post urodynamic testing.  Symptoms look like they were urethral irritation. She had trace RBCs and was apparently not treated with abx.  No urine culture was performed from what I can see in the records. Please see how she is doing. She has a preop with Dr. Sabra Heck on 01/29/17 and with me on 02/06/17. I just want to make sure she does not have infection prior to having surgery.  Records will go to [facility] office.  Cc- Dr. Sabra Heck

## 2017-01-23 NOTE — Telephone Encounter (Signed)
Call to patient. Left message to call back.  

## 2017-01-24 NOTE — Telephone Encounter (Signed)
Return call from patient. States she feels fine now.  All symptoms have resolved and she tested negative at urgent care.  Feels like she just had temporary symptoms related to cath procedure.  Routing to provider for final review. Patient agreeable to disposition. Will close encounter.

## 2017-01-29 ENCOUNTER — Encounter: Payer: Self-pay | Admitting: Obstetrics & Gynecology

## 2017-01-29 ENCOUNTER — Ambulatory Visit (INDEPENDENT_AMBULATORY_CARE_PROVIDER_SITE_OTHER): Payer: BLUE CROSS/BLUE SHIELD | Admitting: Obstetrics & Gynecology

## 2017-01-29 VITALS — BP 112/60 | HR 76 | Resp 16 | Wt 131.0 lb

## 2017-01-29 DIAGNOSIS — D251 Intramural leiomyoma of uterus: Secondary | ICD-10-CM

## 2017-01-29 DIAGNOSIS — N393 Stress incontinence (female) (male): Secondary | ICD-10-CM | POA: Diagnosis not present

## 2017-01-29 DIAGNOSIS — N852 Hypertrophy of uterus: Secondary | ICD-10-CM | POA: Diagnosis not present

## 2017-01-29 MED ORDER — IBUPROFEN 800 MG PO TABS: 800.0000 mg | ORAL_TABLET | Freq: Three times a day (TID) | ORAL | 0 refills | Status: DC | PRN

## 2017-01-29 MED ORDER — OXYCODONE-ACETAMINOPHEN 5-325 MG PO TABS: 1.0000 | ORAL_TABLET | Freq: Four times a day (QID) | ORAL | 0 refills | Status: DC | PRN

## 2017-01-29 NOTE — Progress Notes (Signed)
56 y.o. [id number] MarriedCaucasian female here for discussion of upcoming procedure. TLH with BSO and cystoscopy is planned.  She has seen Dr. Quincy Simmonds and will have a mid-urethral sling placed at the same time for treatment of stress urinary incontinence.  She is sure that she wants to have both ovaries removed with the procedure.  She is aware she may have more hot flashes after that is performed.  As well, she may have a small increased risks of CVD as well.  She is willing to accept these risks.  Procedure discussed with patient.  Hospital stay, recovery and pain management all discussed.  Risks discussed including but not limited to bleeding, 1% risk of receiving a  transfusion, infection, 3-4% risk of bowel/bladder/ureteral/vascular injury discussed as well as possible need for additional surgery if injury does occur discussed.  DVT/PE and rare risk of death discussed.  My actual complications with prior surgeries discussed.  Vaginal cuff dehiscence discussed.  Hernia formation discussed.  Positioning and incision locations discussed.  Patient aware if pathology abnormal she may need additional treatment.  All questions answered.    Ob Hx:   [name] was 06/23/2015.          Sexually active: Yes.   Birth control: post-menopausal. Last pap: 09/15/16 negative, HR HPV negative, 09/10/14 negative  Last MMG: 10/28/15 BIRADS 2 benign  Tobacco: Never smoker   Past Surgical History:  Procedure Laterality Date  . COLONOSCOPY WITH PROPOFOL  2015  . DILATATION & CURETTAGE/HYSTEROSCOPY WITH MYOSURE N/A 09/29/2016   Procedure: DILATATION & CURETTAGE/HYSTEROSCOPY, VULVA BIOPSIES;  Surgeon: Megan Salon, MD;  Location: [place];  Service: Gynecology;  Laterality: N/A;  . TONSILLECTOMY AND ADENOIDECTOMY  child  . WISDOM TOOTH EXTRACTION  20 approx.    Past Medical History:  Diagnosis Date  . Family history of adverse reaction to anesthesia    sister- ponv  .  Multinodular thyroid    goiter--- per pt consulted w/ endocrinologist, told benign cyst  . PMB (postmenopausal bleeding)   . Thickened endometrium    and endometrial irregularity    Allergies: Patient has no known allergies.  Current Outpatient Prescriptions  Medication Sig Dispense Refill  . BIOTIN PO Take by mouth daily.     . Cyanocobalamin (VITAMIN B 12 PO) Take by mouth daily.     Marland Kitchen ibuprofen (ADVIL,MOTRIN) 200 MG tablet Take 200 mg by mouth as needed.    . Omega-3 Fatty Acids (FISH OIL PO) Take by mouth daily.      No current facility-administered medications for this visit.     ROS: A comprehensive review of systems was negative.  Exam:    BP 112/60 (BP Location: Right Arm, Patient Position: Sitting, Cuff Size: Normal)   Pulse 76   Resp 16   Wt 131 lb (59.4 kg)   LMP 06/23/2015   BMI 23.58 kg/m   General appearance: alert and cooperative Head: Normocephalic, without obvious abnormality, atraumatic Neck: no adenopathy, supple, symmetrical, trachea midline and thyroid not enlarged, symmetric, no tenderness/mass/nodules Lungs: clear to auscultation bilaterally Heart: regular rate and rhythm, S1, S2 normal, no murmur, click, rub or gallop Abdomen: soft, non-tender; bowel sounds normal; no masses,  no organomegaly Extremities: extremities normal, atraumatic, no cyanosis or edema Skin: Skin color, texture, turgor normal. No rashes or lesions Lymph nodes: Cervical, supraclavicular, and axillary nodes normal. no inguinal nodes palpated Neurologic: Grossly normal  Pelvic: External genitalia:  no lesions  Urethra: normal appearing urethra with no masses, tenderness or lesions              Bartholins and Skenes: normal                 Vagina: normal appearing vagina with normal color and discharge, no lesions              Cervix: normal appearance              Pap taken: No.        Bimanual Exam:  Uterus:  enlarged to 10 week's size                                       Adnexa:    not indicated and no masses                                      Rectovaginal: Deferred                                      Anus:  No lesions  A: Enlarged uterus with 7cm fibroid SUI  P:  TLH/BSO/cystoscopy, mid-urethral slin planned Rx for Motrin and Percocet given. Medications/Vitamins reviewed.  Hysterectomy brochure given for pre and post op instructions.

## 2017-01-30 NOTE — Patient Instructions (Signed)
Your procedure is scheduled on:  Monday, Sept. 24, 2018  Enter through the [employer] [facility] at:  6:00 AM  Pick up the phone at the desk and dial [phone].  Call this number if you have problems the morning of surgery: [phone].  Remember: Do NOT eat food or drink after:  Midnight Sunday  Take these medicines the morning of surgery with a SIP OF WATER:  None  Stop ALL herbal medications at this time  Do NOT smoke the day of surgery.  Do NOT wear jewelry (body piercing), metal hair clips/bobby pins, make-up, artifical eyelashes or nail polish. Do NOT wear lotions, powders, or perfumes.  You may wear deodorant. Do NOT shave for 48 hours prior to surgery. Do NOT bring valuables to the hospital. Contacts, dentures, or bridgework may not be worn into surgery.  Leave suitcase in car.  After surgery it may be brought to your room.  For patients admitted to the hospital, checkout time is 11:00 AM the day of discharge.  Bring a copy of your healthcare power of attorney and living will documents.

## 2017-01-31 ENCOUNTER — Encounter (HOSPITAL_COMMUNITY): Payer: Self-pay

## 2017-01-31 ENCOUNTER — Other Ambulatory Visit: Payer: Self-pay | Admitting: Obstetrics & Gynecology

## 2017-01-31 ENCOUNTER — Encounter (HOSPITAL_COMMUNITY)
Admission: RE | Admit: 2017-01-31 | Discharge: 2017-01-31 | Disposition: A | Discharge status: Home / Self Care | Payer: BLUE CROSS/BLUE SHIELD | Source: Ambulatory Visit | Attending: Obstetrics & Gynecology | Admitting: Obstetrics & Gynecology

## 2017-01-31 DIAGNOSIS — D259 Leiomyoma of uterus, unspecified: Secondary | ICD-10-CM | POA: Diagnosis not present

## 2017-01-31 DIAGNOSIS — Z01812 Encounter for preprocedural laboratory examination: Secondary | ICD-10-CM | POA: Insufficient documentation

## 2017-01-31 DIAGNOSIS — N852 Hypertrophy of uterus: Secondary | ICD-10-CM | POA: Insufficient documentation

## 2017-01-31 LAB — CBC
HEMATOCRIT: 42.1 % (ref 36.0–46.0)
HEMOGLOBIN: 14.3 g/dL (ref 12.0–15.0)
MCH: 31.2 pg (ref 26.0–34.0)
MCHC: 34 g/dL (ref 30.0–36.0)
MCV: 91.9 fL (ref 78.0–100.0)
Platelets: 226 10*3/uL (ref 150–400)
RBC: 4.58 MIL/uL (ref 3.87–5.11)
RDW: 13.6 % (ref 11.5–15.5)
WBC: 5.9 10*3/uL (ref 4.0–10.5)

## 2017-01-31 LAB — BASIC METABOLIC PANEL
Anion gap: 6 (ref 5–15)
BUN: 18 mg/dL (ref 6–20)
CHLORIDE: 106 mmol/L (ref 101–111)
CO2: 27 mmol/L (ref 22–32)
Calcium: 9.3 mg/dL (ref 8.9–10.3)
Creatinine, Ser: 0.75 mg/dL (ref 0.44–1.00)
GFR calc Af Amer: >60 mL/min (ref >60)
GFR calc non Af Amer: >60 mL/min (ref >60)
GLUCOSE: 97 mg/dL (ref 65–99)
POTASSIUM: 4.7 mmol/L (ref 3.5–5.1)
Sodium: 139 mmol/L (ref 135–145)

## 2017-02-06 ENCOUNTER — Encounter: Payer: Self-pay | Admitting: Obstetrics and Gynecology

## 2017-02-06 ENCOUNTER — Ambulatory Visit (INDEPENDENT_AMBULATORY_CARE_PROVIDER_SITE_OTHER): Payer: BLUE CROSS/BLUE SHIELD | Admitting: Obstetrics and Gynecology

## 2017-02-06 VITALS — BP 110/86 | HR 64 | Resp 16 | Ht 62.5 in | Wt 130.0 lb

## 2017-02-06 DIAGNOSIS — N393 Stress incontinence (female) (male): Secondary | ICD-10-CM

## 2017-02-06 NOTE — Progress Notes (Signed)
GYNECOLOGY  VISIT   HPI: 56 y.o.   Married  Caucasian  female   G2P2002 with [name] was 06/23/2015.   here for  Surgical consultation.  Desires surgery for urinary stress incontinence at the time of laparoscopic hysterectomy with bilateral salpingectomy for large uterine fibroid with Dr. Sabra Heck.  Patient wears a pad daily for urinary incontinence.  She leaks with cough, walking, and exercise.  She denies constipation or fecal incontinence.  Notes pressure to void with intercourse.   Simple urodynamic testing in the office confirms stress incontinence.   No prior pelvic surgery.  GYNECOLOGIC HISTORY: [name] was 06/23/2015. Contraception:  Nothing. Menopausal hormone therapy:  none Last mammogram:  ??10/28/15 BIRADS 2 benign  Last pap smear:   09/15/16 negative, HR HPV negative, 09/10/14 negative         OB History    Gravida Para Term Preterm AB Living   2 2 2  0 0 2   SAB TAB Ectopic Multiple Live Births   0 0 0 0 2         Patient Active Problem List   Diagnosis Date Noted  . Multinodular goiter 09/25/2014    Past Medical History:  Diagnosis Date  . Family history of adverse reaction to anesthesia    sister- ponv  . Multinodular thyroid    goiter--- per pt consulted w/ endocrinologist, told benign cyst  . PMB (postmenopausal bleeding)   . Thickened endometrium    and endometrial irregularity    Past Surgical History:  Procedure Laterality Date  . COLONOSCOPY WITH PROPOFOL  2015  . DILATATION & CURETTAGE/HYSTEROSCOPY WITH MYOSURE N/A 09/29/2016   Procedure: DILATATION & CURETTAGE/HYSTEROSCOPY, VULVA BIOPSIES;  Surgeon: Megan Salon, MD;  Location: [place];  Service: Gynecology;  Laterality: N/A;  . TONSILLECTOMY AND ADENOIDECTOMY  child  . WISDOM TOOTH EXTRACTION  20 approx.    Current Outpatient Prescriptions  Medication Sig Dispense Refill  . BIOTIN PO Take 1 tablet by mouth daily.     .  Cyanocobalamin (VITAMIN B 12 PO) Take 1 tablet by mouth daily.     Marland Kitchen ibuprofen (ADVIL,MOTRIN) 200 MG tablet Take 400 mg by mouth every 6 (six) hours as needed for headache or mild pain.     Marland Kitchen ibuprofen (ADVIL,MOTRIN) 800 MG tablet Take 1 tablet (800 mg total) by mouth every 8 (eight) hours as needed. (Patient not taking: Reported on 02/06/2017) 30 tablet 0  . Omega-3 Fatty Acids (FISH OIL PO) Take 1 capsule by mouth daily.     Marland Kitchen oxyCODONE-acetaminophen (PERCOCET) 5-325 MG tablet Take 1-2 tablets by mouth every 6 (six) hours as needed. use only as much as needed to relieve pain (Patient not taking: Reported on 02/06/2017) 30 tablet 0   No current facility-administered medications for this visit.      ALLERGIES: Patient has no known allergies.  Family History  Problem Relation Age of Onset  . Lung disease Father   . Hypertension Mother   . Cancer Sister        hodgekins  . Thyroid cancer Paternal Grandmother   . Thyroid disease Neg Hx     Social History   Social History  . Marital status: Married    Spouse name: N/A  . Number of children: N/A  . Years of education: N/A   Occupational History  . Not on file.   Social History Main Topics  . Smoking status: Never Smoker  . Smokeless tobacco: Never  Used  . Alcohol use 1.2 - 2.4 oz/week    2 - 4 Standard drinks or equivalent per week     Comment: occasional  . Drug use: No  . Sexual activity: Yes    Partners: Male    Birth control/ protection: Post-menopausal   Other Topics Concern  . Not on file   Social History Narrative  . No narrative on file    ROS:  Pertinent items are noted in HPI.  PHYSICAL EXAMINATION:    BP 110/86 (BP Location: Right Arm, Patient Position: Sitting, Cuff Size: Normal)   Pulse 64   Resp 16   Ht 5' 2.5" (1.588 m)   Wt 130 lb (59 kg)   LMP 06/23/2015   BMI 23.40 kg/m     General appearance: alert, cooperative and appears stated age  ASSESSMENT  Genuine stress incontinence.  Uterine  fibroid.   PLAN  Proceed with TVT Exact midurethral sling and cystoscopy at time of laparoscopic hysterectomy.  Risks, benefits, and alternatives discussed with the patient who wishes to proceed.  Surgical expectations and recovery discussed.    An After Visit Summary was printed and given to the patient.

## 2017-02-06 NOTE — Progress Notes (Signed)
Thank you for seeing this patient! 

## 2017-02-08 ENCOUNTER — Other Ambulatory Visit: Payer: Self-pay | Admitting: Obstetrics & Gynecology

## 2017-02-08 NOTE — Telephone Encounter (Signed)
Medication refill request: ibuprofen  Last AEX:  09/15/16 DL Next AEX: 09/19/17 DL Last MMG (if hormonal medication request): 10/28/15 BIRADS2:Benign  Refill authorized: 01/29/17 #[id number] [place]. Rx was printed.

## 2017-02-11 ENCOUNTER — Encounter (HOSPITAL_COMMUNITY): Payer: Self-pay | Admitting: Anesthesiology

## 2017-02-11 NOTE — H&P (Signed)
Office Visit   02/06/2017 Emily Montgomery, Emily All, MD  Obstetrics and Gynecology   Stress incontinence  Dx   Pre-op Exam ; Referred by Regina Eck, CNM  Reason for Visit   Additional Documentation   Vitals:   BP 110/86 (BP Location: Right Arm, Patient Position: Sitting, Cuff Size: Normal)   Pulse 64   Resp 16   Ht 5' 2.5" (1.588 m)   Wt 130 lb (59 kg)   LMP 06/23/2015   BMI 23.40 kg/m   BSA 1.61 m   Flowsheets:   Custom Formula Data,   MEWS Score,   Anthropometrics     Encounter Info:   Billing Info,   History,   Allergies,   Detailed Report     Montgomery Notes   Progress Notes by Regina Eck, CNM at 02/06/2017 3:45 PM   Author: Regina Eck, CNM Author Type: Certified Nurse Midwife Filed: 02/06/2017 5:56 PM  Note Status: Signed Cosign: Cosign Not Required Encounter Date: 02/06/2017  Editor: Regina Eck, CNM (Certified Nurse Midwife)    Thank you for seeing this patient.    Progress Notes by Nunzio Cobbs, MD at 02/06/2017 3:45 PM   Author: Nunzio Cobbs, MD Author Type: Physician Filed: 02/06/2017 5:21 PM  Note Status: Signed Cosign: Cosign Not Required Encounter Date: 02/06/2017  Editor: Nunzio Cobbs, MD (Physician)  Prior Versions: 1. Archie Balboa CMA (Certified Psychologist, sport and exercise) at 02/06/2017 8:52 AM - Sign at close encounter    GYNECOLOGY  VISIT   HPI: 56 y.o.   Married  Caucasian  female   G2P2002 with [name] was 06/23/2015.   here for  Surgical consultation.  Desires surgery for urinary stress incontinence at the time of laparoscopic hysterectomy with bilateral salpingectomy for large uterine fibroid with Dr. Sabra Heck.  Patient wears a pad daily for urinary incontinence.  She leaks with cough, walking, and exercise.  She denies constipation or fecal incontinence.  Notes pressure to void with intercourse.   Simple urodynamic  testing in the office confirms stress incontinence.   No prior pelvic surgery.  GYNECOLOGIC HISTORY: [name] was 06/23/2015. Contraception:  Nothing. Menopausal hormone therapy:  none Last mammogram:  ??10/28/15 BIRADS 2 benign  Last pap smear:   09/15/16 negative, HR HPV negative, 09/10/14 negative                 OB History    Gravida Para Term Preterm AB Living   2 2 2  0 0 2   SAB TAB Ectopic Multiple Live Births   0 0 0 0 2             Patient Active Problem List   Diagnosis Date Noted  . Multinodular goiter 09/25/2014        Past Medical History:  Diagnosis Date  . Family history of adverse reaction to anesthesia    sister- ponv  . Multinodular thyroid    goiter--- per pt consulted w/ endocrinologist, told benign cyst  . PMB (postmenopausal bleeding)   . Thickened endometrium    and endometrial irregularity         Past Surgical History:  Procedure Laterality Date  . COLONOSCOPY WITH PROPOFOL  2015  . DILATATION & CURETTAGE/HYSTEROSCOPY WITH MYOSURE N/A 09/29/2016   Procedure: DILATATION & CURETTAGE/HYSTEROSCOPY, VULVA BIOPSIES;  Surgeon: Megan Salon, MD;  Location: [facility];  Service: Gynecology;  Laterality: N/A;  . TONSILLECTOMY AND ADENOIDECTOMY  child  . WISDOM TOOTH EXTRACTION  20 approx.          Current Outpatient Prescriptions  Medication Sig Dispense Refill  . BIOTIN PO Take 1 tablet by mouth daily.     . Cyanocobalamin (VITAMIN B 12 PO) Take 1 tablet by mouth daily.     Marland Kitchen ibuprofen (ADVIL,MOTRIN) 200 MG tablet Take 400 mg by mouth every 6 (six) hours as needed for headache or mild pain.     Marland Kitchen ibuprofen (ADVIL,MOTRIN) 800 MG tablet Take 1 tablet (800 mg total) by mouth every 8 (eight) hours as needed. (Patient not taking: Reported on 02/06/2017) 30 tablet 0  . Omega-3 Fatty Acids (FISH OIL PO) Take 1 capsule by mouth daily.     Marland Kitchen oxyCODONE-acetaminophen (PERCOCET) 5-325 MG  tablet Take 1-2 tablets by mouth every 6 (six) hours as needed. use only as much as needed to relieve pain (Patient not taking: Reported on 02/06/2017) 30 tablet 0   No current facility-administered medications for this visit.      ALLERGIES: Patient has no known allergies.       Family History  Problem Relation Age of Onset  . Lung disease Father   . Hypertension Mother   . Cancer Sister        hodgekins  . Thyroid cancer Paternal Grandmother   . Thyroid disease Neg Hx     Social History        Social History  . Marital status: Married    Spouse name: N/A  . Number of children: N/A  . Years of education: N/A      Occupational History  . Not on file.         Social History Main Topics  . Smoking status: Never Smoker  . Smokeless tobacco: Never Used  . Alcohol use 1.2 - 2.4 oz/week    2 - 4 Standard drinks or equivalent per week     Comment: occasional  . Drug use: No  . Sexual activity: Yes    Partners: Male    Birth control/ protection: Post-menopausal       Other Topics Concern  . Not on file      Social History Narrative  . No narrative on file    ROS:  Pertinent items are noted in HPI.  PHYSICAL EXAMINATION:    BP 110/86 (BP Location: Right Arm, Patient Position: Sitting, Cuff Size: Normal)   Pulse 64   Resp 16   Ht 5' 2.5" (1.588 m)   Wt 130 lb (59 kg)   LMP 06/23/2015   BMI 23.40 kg/m     General appearance: alert, cooperative and appears stated age  ASSESSMENT  Genuine stress incontinence.  Uterine fibroid.   PLAN  Proceed with TVT Exact midurethral sling and cystoscopy at time of laparoscopic hysterectomy.  Risks, benefits, and alternatives discussed with the patient who wishes to proceed.  Surgical expectations and recovery discussed.    An After Visit Summary was printed and given to the patient.

## 2017-02-11 NOTE — Anesthesia Preprocedure Evaluation (Addendum)
Anesthesia Evaluation  Patient identified by MRN, date of birth, ID band Patient awake    Reviewed: Allergy & Precautions, NPO status , Patient's Chart, lab work & pertinent test results  History of Anesthesia Complications (+) Family history of anesthesia reaction  Airway Mallampati: I  TM Distance: >3 FB Neck ROM: Full    Dental no notable dental hx. (+) Teeth Intact, Caps   Pulmonary neg pulmonary ROS,    Pulmonary exam normal breath sounds clear to auscultation       Cardiovascular negative cardio ROS Normal cardiovascular exam Rhythm:Regular Rate:Normal     Neuro/Psych negative neurological ROS  negative psych ROS   GI/Hepatic negative GI ROS, Neg liver ROS,   Endo/Other  Hx/o Multinodular goiter  Renal/GU negative Renal ROS  negative genitourinary   Musculoskeletal negative musculoskeletal ROS (+)   Abdominal   Peds  Hematology negative hematology ROS (+)   Anesthesia Other Findings   Reproductive/Obstetrics PMB Uterine fibroid                            Anesthesia Physical Anesthesia Plan  ASA: II  Anesthesia Plan: General   Post-op Pain Management:    Induction: Intravenous  PONV Risk Score and Plan: 4 or greater and Ondansetron, Dexamethasone, Midazolam, Scopolamine patch - Pre-op, Propofol infusion and Metaclopromide  Airway Management Planned: Oral ETT  Additional Equipment:   Intra-op Plan:   Post-operative Plan: Extubation in OR  Informed Consent: I have reviewed the patients History and Physical, chart, labs and discussed the procedure including the risks, benefits and alternatives for the proposed anesthesia with the patient or authorized representative who has indicated his/her understanding and acceptance.   Dental advisory given  Plan Discussed with: CRNA, Anesthesiologist and Surgeon  Anesthesia Plan Comments:        Anesthesia Quick  Evaluation

## 2017-02-12 ENCOUNTER — Encounter (HOSPITAL_COMMUNITY): Payer: Self-pay

## 2017-02-12 ENCOUNTER — Encounter (HOSPITAL_COMMUNITY)
Admission: AD | Disposition: A | Discharge status: Home / Self Care | Payer: Self-pay | Source: Ambulatory Visit | Attending: Obstetrics & Gynecology

## 2017-02-12 ENCOUNTER — Ambulatory Visit (HOSPITAL_COMMUNITY): Payer: BLUE CROSS/BLUE SHIELD | Admitting: Anesthesiology

## 2017-02-12 ENCOUNTER — Ambulatory Visit (HOSPITAL_COMMUNITY)
Admission: AD | Admit: 2017-02-12 | Discharge: 2017-02-13 | Disposition: A | Discharge status: Home / Self Care | Payer: BLUE CROSS/BLUE SHIELD | Source: Ambulatory Visit | Attending: Obstetrics & Gynecology | Admitting: Obstetrics & Gynecology

## 2017-02-12 DIAGNOSIS — N800 Endometriosis of uterus: Secondary | ICD-10-CM | POA: Diagnosis not present

## 2017-02-12 DIAGNOSIS — N393 Stress incontinence (female) (male): Secondary | ICD-10-CM | POA: Diagnosis not present

## 2017-02-12 DIAGNOSIS — Z808 Family history of malignant neoplasm of other organs or systems: Secondary | ICD-10-CM | POA: Diagnosis not present

## 2017-02-12 DIAGNOSIS — Z8249 Family history of ischemic heart disease and other diseases of the circulatory system: Secondary | ICD-10-CM | POA: Diagnosis not present

## 2017-02-12 DIAGNOSIS — D259 Leiomyoma of uterus, unspecified: Secondary | ICD-10-CM | POA: Diagnosis not present

## 2017-02-12 DIAGNOSIS — N72 Inflammatory disease of cervix uteri: Secondary | ICD-10-CM | POA: Diagnosis not present

## 2017-02-12 DIAGNOSIS — N852 Hypertrophy of uterus: Secondary | ICD-10-CM | POA: Diagnosis not present

## 2017-02-12 DIAGNOSIS — N950 Postmenopausal bleeding: Secondary | ICD-10-CM | POA: Diagnosis not present

## 2017-02-12 DIAGNOSIS — D251 Intramural leiomyoma of uterus: Secondary | ICD-10-CM | POA: Diagnosis not present

## 2017-02-12 DIAGNOSIS — Z79899 Other long term (current) drug therapy: Secondary | ICD-10-CM | POA: Insufficient documentation

## 2017-02-12 DIAGNOSIS — Z836 Family history of other diseases of the respiratory system: Secondary | ICD-10-CM | POA: Diagnosis not present

## 2017-02-12 HISTORY — PX: BLADDER SUSPENSION: SHX72

## 2017-02-12 HISTORY — PX: LAPAROSCOPIC BILATERAL SALPINGO OOPHERECTOMY: SHX5890

## 2017-02-12 HISTORY — PX: CYSTOSCOPY: SHX5120

## 2017-02-12 HISTORY — PX: LAPAROSCOPIC HYSTERECTOMY: SHX1926

## 2017-02-12 HISTORY — PX: PUBOVAGINAL SLING: SHX1035

## 2017-02-12 LAB — PREGNANCY, URINE: PREG TEST UR: NEGATIVE

## 2017-02-12 SURGERY — HYSTERECTOMY, TOTAL, LAPAROSCOPIC
Anesthesia: General | Site: Abdomen

## 2017-02-12 MED ORDER — KETOROLAC TROMETHAMINE 30 MG/ML IJ SOLN
30.0000 mg | Freq: Four times a day (QID) | INTRAMUSCULAR | Status: DC
  Administered 2017-02-12 – 2017-02-13 (×3): 30 mg via INTRAVENOUS
  Filled 2017-02-12 (×3): qty 1

## 2017-02-12 MED ORDER — SODIUM CHLORIDE 0.9 % IJ SOLN
INTRAMUSCULAR | Status: DC | PRN
  Administered 2017-02-12: 10 mL

## 2017-02-12 MED ORDER — SUGAMMADEX SODIUM 200 MG/2ML IV SOLN
INTRAVENOUS | Status: AC
  Filled 2017-02-12: qty 2

## 2017-02-12 MED ORDER — MORPHINE SULFATE (PF) 4 MG/ML IV SOLN: 1.0000 mg | INTRAVENOUS | Status: DC | PRN

## 2017-02-12 MED ORDER — PANTOPRAZOLE SODIUM 40 MG IV SOLR
40.0000 mg | Freq: Every day | INTRAVENOUS | Status: DC
  Administered 2017-02-12: 40 mg via INTRAVENOUS
  Filled 2017-02-12 (×2): qty 40

## 2017-02-12 MED ORDER — MIDAZOLAM HCL 2 MG/2ML IJ SOLN
INTRAMUSCULAR | Status: AC
  Filled 2017-02-12: qty 2

## 2017-02-12 MED ORDER — STERILE WATER FOR IRRIGATION IR SOLN
Status: DC | PRN
  Administered 2017-02-12: 1000 mL via INTRAVESICAL

## 2017-02-12 MED ORDER — HYDROMORPHONE HCL 1 MG/ML IJ SOLN
INTRAMUSCULAR | Status: AC
  Filled 2017-02-12: qty 1

## 2017-02-12 MED ORDER — DEXTROSE-NACL 5-0.45 % IV SOLN
INTRAVENOUS | Status: DC
  Administered 2017-02-12 – 2017-02-13 (×2): via INTRAVENOUS

## 2017-02-12 MED ORDER — ESTRADIOL 0.1 MG/GM VA CREA
TOPICAL_CREAM | VAGINAL | Status: AC
  Filled 2017-02-12: qty 42.5

## 2017-02-12 MED ORDER — KETOROLAC TROMETHAMINE 30 MG/ML IJ SOLN
INTRAMUSCULAR | Status: DC | PRN
  Administered 2017-02-12: 30 mg via INTRAVENOUS

## 2017-02-12 MED ORDER — CEFOTETAN DISODIUM-DEXTROSE 2-2.08 GM-% IV SOLR
2.0000 g | INTRAVENOUS | Status: AC
  Administered 2017-02-12: 2 g via INTRAVENOUS

## 2017-02-12 MED ORDER — PROPOFOL 10 MG/ML IV BOLUS
INTRAVENOUS | Status: AC
  Filled 2017-02-12: qty 20

## 2017-02-12 MED ORDER — ROCURONIUM BROMIDE 100 MG/10ML IV SOLN
INTRAVENOUS | Status: AC
  Filled 2017-02-12: qty 1

## 2017-02-12 MED ORDER — SODIUM CHLORIDE 0.9 % IV SOLN
INTRAVENOUS | Status: DC | PRN
  Administered 2017-02-12: 60 mL

## 2017-02-12 MED ORDER — GLYCOPYRROLATE 0.2 MG/ML IJ SOLN
INTRAMUSCULAR | Status: DC | PRN
  Administered 2017-02-12: .2 mg via INTRAVENOUS

## 2017-02-12 MED ORDER — FENTANYL CITRATE (PF) 250 MCG/5ML IJ SOLN
INTRAMUSCULAR | Status: AC
  Filled 2017-02-12: qty 5

## 2017-02-12 MED ORDER — PROPOFOL 10 MG/ML IV BOLUS
INTRAVENOUS | Status: DC | PRN
  Administered 2017-02-12: 160 mg via INTRAVENOUS

## 2017-02-12 MED ORDER — BUPIVACAINE HCL (PF) 0.25 % IJ SOLN
INTRAMUSCULAR | Status: AC
  Filled 2017-02-12: qty 30

## 2017-02-12 MED ORDER — SODIUM CHLORIDE 0.9 % IR SOLN
Status: DC | PRN
  Administered 2017-02-12: 3000 mL

## 2017-02-12 MED ORDER — LIDOCAINE-EPINEPHRINE 1 %-1:100000 IJ SOLN
INTRAMUSCULAR | Status: DC | PRN
  Administered 2017-02-12: 4 mL

## 2017-02-12 MED ORDER — ONDANSETRON HCL 4 MG/2ML IJ SOLN
INTRAMUSCULAR | Status: AC
  Filled 2017-02-12: qty 2

## 2017-02-12 MED ORDER — SCOPOLAMINE 1 MG/3DAYS TD PT72
MEDICATED_PATCH | TRANSDERMAL | Status: AC
  Administered 2017-02-12: 1.5 mg via TRANSDERMAL
  Filled 2017-02-12: qty 1

## 2017-02-12 MED ORDER — MEPERIDINE HCL 25 MG/ML IJ SOLN: 6.2500 mg | INTRAMUSCULAR | Status: DC | PRN

## 2017-02-12 MED ORDER — ONDANSETRON HCL 4 MG/2ML IJ SOLN
INTRAMUSCULAR | Status: DC | PRN
  Administered 2017-02-12: 4 mg via INTRAVENOUS

## 2017-02-12 MED ORDER — HYDROMORPHONE HCL 1 MG/ML IJ SOLN
INTRAMUSCULAR | Status: DC | PRN
  Administered 2017-02-12: 1 mg via INTRAVENOUS

## 2017-02-12 MED ORDER — ACETAMINOPHEN 325 MG PO TABS
650.0000 mg | ORAL_TABLET | ORAL | Status: DC | PRN
  Administered 2017-02-12 – 2017-02-13 (×2): 650 mg via ORAL
  Filled 2017-02-12 (×2): qty 2

## 2017-02-12 MED ORDER — MENTHOL 3 MG MT LOZG: 1.0000 | LOZENGE | OROMUCOSAL | Status: DC | PRN

## 2017-02-12 MED ORDER — OXYCODONE-ACETAMINOPHEN 5-325 MG PO TABS: 1.0000 | ORAL_TABLET | ORAL | Status: DC | PRN

## 2017-02-12 MED ORDER — HYDROMORPHONE HCL 1 MG/ML IJ SOLN: 0.2500 mg | INTRAMUSCULAR | Status: DC | PRN

## 2017-02-12 MED ORDER — KETOROLAC TROMETHAMINE 30 MG/ML IJ SOLN: 30.0000 mg | Freq: Four times a day (QID) | INTRAMUSCULAR | Status: DC

## 2017-02-12 MED ORDER — ENOXAPARIN SODIUM 40 MG/0.4ML ~~LOC~~ SOLN
40.0000 mg | SUBCUTANEOUS | Status: AC
  Administered 2017-02-12: 40 mg via SUBCUTANEOUS
  Filled 2017-02-12: qty 0.4

## 2017-02-12 MED ORDER — SUGAMMADEX SODIUM 200 MG/2ML IV SOLN
INTRAVENOUS | Status: DC | PRN
Start: 2017-02-12 — End: 2017-02-12
  Administered 2017-02-12: 150 mg via INTRAVENOUS

## 2017-02-12 MED ORDER — DEXAMETHASONE SODIUM PHOSPHATE 10 MG/ML IJ SOLN
INTRAMUSCULAR | Status: DC | PRN
  Administered 2017-02-12: 10 mg via INTRAVENOUS

## 2017-02-12 MED ORDER — LACTATED RINGERS IV SOLN
INTRAVENOUS | Status: DC
  Administered 2017-02-12: 125 mL/h via INTRAVENOUS
  Administered 2017-02-12 (×2): via INTRAVENOUS

## 2017-02-12 MED ORDER — METOCLOPRAMIDE HCL 5 MG/ML IJ SOLN: 10.0000 mg | Freq: Once | INTRAMUSCULAR | Status: DC | PRN

## 2017-02-12 MED ORDER — ROCURONIUM BROMIDE 100 MG/10ML IV SOLN
INTRAVENOUS | Status: DC | PRN
  Administered 2017-02-12: 50 mg via INTRAVENOUS
  Administered 2017-02-12 (×2): 10 mg via INTRAVENOUS

## 2017-02-12 MED ORDER — 0.9 % SODIUM CHLORIDE (POUR BTL) OPTIME
TOPICAL | Status: DC | PRN
  Administered 2017-02-12: 1000 mL

## 2017-02-12 MED ORDER — ROPIVACAINE HCL 5 MG/ML IJ SOLN
INTRAMUSCULAR | Status: AC
  Filled 2017-02-12: qty 30

## 2017-02-12 MED ORDER — ALUM & MAG HYDROXIDE-SIMETH 200-200-20 MG/5ML PO SUSP: 30.0000 mL | ORAL | Status: DC | PRN

## 2017-02-12 MED ORDER — MIDAZOLAM HCL 2 MG/2ML IJ SOLN
INTRAMUSCULAR | Status: DC | PRN
  Administered 2017-02-12: 2 mg via INTRAVENOUS

## 2017-02-12 MED ORDER — FENTANYL CITRATE (PF) 100 MCG/2ML IJ SOLN
INTRAMUSCULAR | Status: DC | PRN
  Administered 2017-02-12: 50 ug via INTRAVENOUS
  Administered 2017-02-12 (×2): 100 ug via INTRAVENOUS

## 2017-02-12 MED ORDER — BUPIVACAINE HCL (PF) 0.25 % IJ SOLN
INTRAMUSCULAR | Status: DC | PRN
  Administered 2017-02-12: 10 mL

## 2017-02-12 MED ORDER — LIDOCAINE-EPINEPHRINE 1 %-1:100000 IJ SOLN
INTRAMUSCULAR | Status: AC
  Filled 2017-02-12: qty 1

## 2017-02-12 MED ORDER — CEFOTETAN DISODIUM-DEXTROSE 2-2.08 GM-% IV SOLR
INTRAVENOUS | Status: AC
  Filled 2017-02-12: qty 50

## 2017-02-12 MED ORDER — SIMETHICONE 80 MG PO CHEW: 80.0000 mg | CHEWABLE_TABLET | Freq: Four times a day (QID) | ORAL | Status: DC | PRN

## 2017-02-12 MED ORDER — SODIUM CHLORIDE 0.9 % IJ SOLN
INTRAMUSCULAR | Status: AC
  Filled 2017-02-12: qty 50

## 2017-02-12 MED ORDER — SCOPOLAMINE 1 MG/3DAYS TD PT72
1.0000 | MEDICATED_PATCH | Freq: Once | TRANSDERMAL | Status: DC
  Administered 2017-02-12: 1.5 mg via TRANSDERMAL

## 2017-02-12 MED ORDER — LIDOCAINE HCL (CARDIAC) 20 MG/ML IV SOLN
INTRAVENOUS | Status: DC | PRN
  Administered 2017-02-12: 80 mg via INTRAVENOUS

## 2017-02-12 MED ORDER — LIDOCAINE HCL (CARDIAC) 20 MG/ML IV SOLN
INTRAVENOUS | Status: AC
  Filled 2017-02-12: qty 5

## 2017-02-12 SURGICAL SUPPLY — 79 items
APPLICATOR ARISTA FLEXITIP XL (MISCELLANEOUS) ×6 IMPLANT
BLADE SURG 11 STRL SS (BLADE) ×6 IMPLANT
BLADE SURG 15 STRL LF C SS BP (BLADE) ×4 IMPLANT
BLADE SURG 15 STRL SS (BLADE) ×2
CABLE HIGH FREQUENCY MONO STRZ (ELECTRODE) ×6 IMPLANT
CANISTER SUCT 3000ML PPV (MISCELLANEOUS) ×6 IMPLANT
CATH FOLEY 2WAY SLVR  5CC 18FR (CATHETERS) ×2
CATH FOLEY 2WAY SLVR 5CC 18FR (CATHETERS) ×4 IMPLANT
CLOSURE WOUND 1/2 X4 (GAUZE/BANDAGES/DRESSINGS)
CLOTH BEACON ORANGE TIMEOUT ST (SAFETY) ×6 IMPLANT
COVER LIGHT HANDLE  1/PK (MISCELLANEOUS) ×4
COVER LIGHT HANDLE 1/PK (MISCELLANEOUS) ×8 IMPLANT
COVER MAYO STAND STRL (DRAPES) ×6 IMPLANT
DERMABOND ADVANCED (GAUZE/BANDAGES/DRESSINGS) ×2
DERMABOND ADVANCED .7 DNX12 (GAUZE/BANDAGES/DRESSINGS) ×4 IMPLANT
DURAPREP 26ML APPLICATOR (WOUND CARE) ×6 IMPLANT
GAUZE PACKING 2X5 YD STRL (GAUZE/BANDAGES/DRESSINGS) IMPLANT
GLOVE BIO SURGEON STRL SZ 6.5 (GLOVE) ×5 IMPLANT
GLOVE BIO SURGEONS STRL SZ 6.5 (GLOVE) ×1
GLOVE BIOGEL PI IND STRL 7.0 (GLOVE) ×16 IMPLANT
GLOVE BIOGEL PI INDICATOR 7.0 (GLOVE) ×8
GLOVE ECLIPSE 6.5 STRL STRAW (GLOVE) ×12 IMPLANT
GOWN STRL REUS W/TWL LRG LVL3 (GOWN DISPOSABLE) ×18 IMPLANT
HEMOSTAT ARISTA ABSORB 3G PWDR (MISCELLANEOUS) ×6 IMPLANT
LIGASURE VESSEL 5MM BLUNT TIP (ELECTROSURGICAL) ×6 IMPLANT
NEEDLE HYPO 22GX1.5 SAFETY (NEEDLE) ×6 IMPLANT
NEEDLE INSUFFLATION 120MM (ENDOMECHANICALS) ×6 IMPLANT
NEEDLE MAYO CATGUT SZ4 (NEEDLE) IMPLANT
NEEDLE SPNL 22GX3.5 QUINCKE BK (NEEDLE) IMPLANT
NS IRRIG 1000ML POUR BTL (IV SOLUTION) ×12 IMPLANT
OCCLUDER COLPOPNEUMO (BALLOONS) ×6 IMPLANT
PACK LAPAROSCOPY BASIN (CUSTOM PROCEDURE TRAY) ×6 IMPLANT
PACK TRENDGUARD 450 HYBRID PRO (MISCELLANEOUS) ×4 IMPLANT
PACK VAGINAL WOMENS (CUSTOM PROCEDURE TRAY) ×6 IMPLANT
PAD MAGNETIC INST (MISCELLANEOUS) IMPLANT
PLUG CATH AND CAP STER (CATHETERS) IMPLANT
POUCH LAPAROSCOPIC INSTRUMENT (MISCELLANEOUS) ×6 IMPLANT
PROTECTOR NERVE ULNAR (MISCELLANEOUS) ×12 IMPLANT
SCISSORS LAP 5X35 DISP (ENDOMECHANICALS) ×6 IMPLANT
SCISSORS MNPLR CVD DVNC (INSTRUMENTS) ×4 IMPLANT
SCISSORS MONOPOLAR CVD (INSTRUMENTS) ×2
SET CYSTO W/LG BORE CLAMP LF (SET/KITS/TRAYS/PACK) ×6 IMPLANT
SET IRRIG TUBING LAPAROSCOPIC (IRRIGATION / IRRIGATOR) ×6 IMPLANT
SET TRI-LUMEN FLTR TB AIRSEAL (TUBING) ×6 IMPLANT
SHEARS HARMONIC ACE PLUS 36CM (ENDOMECHANICALS) ×6 IMPLANT
SLEEVE ADV FIXATION 5X100MM (TROCAR) ×6 IMPLANT
SLEEVE XCEL OPT CAN 5 100 (ENDOMECHANICALS) ×6 IMPLANT
SLING TVT EXACT (Sling) ×6 IMPLANT
SOLUTION ELECTROLUBE (MISCELLANEOUS) ×6 IMPLANT
STRIP CLOSURE SKIN 1/2X4 (GAUZE/BANDAGES/DRESSINGS) IMPLANT
SURGIFLO W/THROMBIN 8M KIT (HEMOSTASIS) ×6 IMPLANT
SUT PDS AB 0 CT1 27 (SUTURE) IMPLANT
SUT PDS AB 2-0 CT1 27 (SUTURE) IMPLANT
SUT PROLENE 1 CT 1 30 (SUTURE) IMPLANT
SUT VIC AB 0 CT1 27 (SUTURE) ×4
SUT VIC AB 0 CT1 27XBRD ANBCTR (SUTURE) ×8 IMPLANT
SUT VIC AB 2-0 CT1 27 (SUTURE) ×4
SUT VIC AB 2-0 CT1 TAPERPNT 27 (SUTURE) ×8 IMPLANT
SUT VIC AB 2-0 CT2 27 (SUTURE) IMPLANT
SUT VIC AB 2-0 SH 27 (SUTURE) ×2
SUT VIC AB 2-0 SH 27XBRD (SUTURE) ×4 IMPLANT
SUT VIC AB 4-0 PS2 18 (SUTURE) ×12 IMPLANT
SUT VICRYL 0 UR6 27IN ABS (SUTURE) ×6 IMPLANT
SUT VLOC 180 0 9IN  GS21 (SUTURE) ×2
SUT VLOC 180 0 9IN GS21 (SUTURE) ×4 IMPLANT
SYR 50ML LL SCALE MARK (SYRINGE) ×12 IMPLANT
SYRINGE 10CC LL (SYRINGE) ×12 IMPLANT
TIP UTERINE 6.7X8CM BLUE DISP (MISCELLANEOUS) ×6 IMPLANT
TOWEL OR 17X24 6PK STRL BLUE (TOWEL DISPOSABLE) ×24 IMPLANT
TRAY FOLEY CATH SILVER 14FR (SET/KITS/TRAYS/PACK) ×12 IMPLANT
TRAY FOLEY CATH SILVER 16FR (SET/KITS/TRAYS/PACK) ×6 IMPLANT
TRENDGUARD 450 HYBRID PRO PACK (MISCELLANEOUS) ×6
TROCAR ADV FIXATION 5X100MM (TROCAR) ×6 IMPLANT
TROCAR PORT AIRSEAL 5X120 (TROCAR) ×6 IMPLANT
TROCAR XCEL NON BLADE 8MM B8LT (ENDOMECHANICALS) ×6 IMPLANT
TROCAR XCEL NON-BLD 5MMX100MML (ENDOMECHANICALS) ×6 IMPLANT
TUBING NON-CON 1/4 X 20 CONN (TUBING) ×5 IMPLANT
TUBING NON-CON 1/4 X 20' CONN (TUBING) ×1
WARMER LAPAROSCOPE (MISCELLANEOUS) ×6 IMPLANT

## 2017-02-12 NOTE — Anesthesia Postprocedure Evaluation (Signed)
Anesthesia Post Note  Patient: Emily Montgomery  Procedure(s) Performed: Procedure(s) (LRB): HYSTERECTOMY TOTAL LAPAROSCOPIC (N/A) LAPAROSCOPIC BILATERAL SALPINGO OOPHORECTOMY (Bilateral) TRANSVAGINAL TAPE (TVT) PROCEDURE (N/A) mid-uretheral SLING (N/A) CYSTOSCOPY (N/A)     Patient location during evaluation: PACU Anesthesia Type: General Level of consciousness: awake and alert Pain management: pain level controlled Vital Signs Assessment: post-procedure vital signs reviewed and stable Respiratory status: spontaneous breathing, nonlabored ventilation and respiratory function stable Cardiovascular status: blood pressure returned to baseline and stable Postop Assessment: no apparent nausea or vomiting Anesthetic complications: no    Last Vitals:  Vitals:   02/12/17 1236 02/12/17 1255  BP:  137/63  Pulse: 66 70  Resp:    Temp: 36.5 C (!) 36.4 C  SpO2: 97% 97%    Last Pain:  Vitals:   02/12/17 1255  TempSrc:   PainSc: 3    Pain Goal: Patients Stated Pain Goal: 3 (02/12/17 0630)               Catalina Gravel

## 2017-02-12 NOTE — Addendum Note (Signed)
Addendum  created 02/12/17 1609 by Raenette Rover, CRNA   Sign clinical note

## 2017-02-12 NOTE — Op Note (Signed)
OPERATIVE REPORT   PREOPERATIVE DIAGNOSES:   Genuine stress incontinence.  POSTOPERATIVE DIAGNOSES:   Genuine stress incontinence.  PROCEDURES:  TVT Exact midurethral sling and cystoscopy.  SURGEON:  Lenard Galloway, M.D.  ASSISTANT:    Lyman Speller, M.D.  ANESTHESIA:  General endotracheal, local with 1% lidocaine with epinephrine, 1:100,000.  EBL:  125 cc for the midurethral sling and cystoscopy.  URINE OUTPUT:   300 cc total.  IV FLUIDS:  2000 cc LR total.  COMPLICATIONS:  None.  INDICATIONS FOR THE PROCEDURE:  The patient is a 56 year old old Gravida 2, 70 2 Caucasian female who presents with urinary incontinence with exercising and coughing. The patient underwent simple office cystometrics, and she was diagnosed with genuine stress incontinence.  She has a known 8 cm uterine fibroid and Dr. Sabra Heck is planning a laparoscopic hysterectomy with bilateral salpingo-oophorectomy. The patient desires surgical repair and a plan is made to proceed now with a TVT Exact midurethral sling and cystoscopy at the time of her hysterectomy procedure.  Risks, benefits, and alternatives are reviewed with the patient, who wishes to proceed.  FINDINGS:  Examination under anesthesia revealed a large uterine fibroid.   She has good anterior vaginal support.  The bladder was visualized throughout 360 degrees and was normal.  There  was no foreign body in the bladder or the urethra.  The ureters were noted to  be patent bilaterally.  SPECIMENS:  None.  DESCRIPTION OF PROCEDURE:  The patient was reidentified in the preoperative hold area.  She received Cefotetan 2 grams IV for antibiotic prophylaxis. She received Lovenox and PAS for DVT prophylaxis.  The patient was transferred to the operating room where she was placed in the dorsal lithotomy position with Allen stirrups.  General endotracheal anesthesia was induced.    An examination under anesthesia was performed.  The patient's  lower abdomen, vagina and perineum were then sterilely  prepped and she was draped.  A Foley catheter was sterilely placed  inside the bladder and left to gravity drainage throughout the procedure.  The patient underwent her hysterectomy with bilateral salpingo-oophorectomy and cystoscopy under Dr. Ammie Ferrier direction.  Please refer to this dictation separately. Hemostasis was good at the end of this procedure.  Allis clamps were used to mark the anterior vaginal wall from 1 cm below the urethra to the vaginal apex.  The anterior vaginal wall mucosa was injected locally with 1% lidocaine with epinephrine, 1:100,000.  The vaginal mucosa was then incised vertically in the midline with the scalpel.  With a combination of sharp and blunt dissection, the subvaginal tissue was dissected off the bladder bilaterally.  The dissection was carried back to the pubic rami anteriorly.   The TVT Exact midurethral sling was performed.  The 5 mm suprapubic incisions were created with a scalpel to the right and left of the midline.  The TVT Exact was performed in a bottom-up fashion.  The Foley catheter was removed and the Foley tip with the obturator guide was placed inside the urethra and deflected properly.  The guide was placed through the right retropubic space and then up through the right suprapubic incision.  This was performed without difficulty.  The urethra was deflected in opposite direction and the same was then performed on the patient's left-hand side.  The obturator guide was removed and cystoscopy was performed and there was no evidence of  a foreign body in the bladder or urethra.    All cystoscopic fluid was drained  and the Foley catheter was replaced. The sling was brought up through the suprapubic incisions bilaterally. A Kelly clamp was placed between the sling and the urethra, and the plastic sheaths were removed.  The sling was trimmed suprapubically. The sling was noted to be in  good position.  The Foley was removed and final cystoscopy was performed using D10W  which allowed for visualization of the ureteral jets bilaterally.  The cystoscopic fluid was then drained from the bladder and the Foley was replaced and left to gravity drainage.   Hemostasis was achieved by placing Surgiflow into the right retropublic sling site though the vaginal incision.  The anterior vaginal wall mucosa was trimmed and then the anterior vaginal wall was closed with a running locked suture of 2-0 Vicryl.  The suprapubic incisions were closed with Dermabond.  The patient was awakened and extubated, and escorted to the recovery room in stable condition.  There were no complications.  All needle, instrument, and sponge counts were correct.   Lenard Galloway, M.D.

## 2017-02-12 NOTE — Progress Notes (Signed)
Day of Surgery Procedure(s) (LRB): HYSTERECTOMY TOTAL LAPAROSCOPIC (N/A) LAPAROSCOPIC BILATERAL SALPINGO OOPHORECTOMY (Bilateral) TRANSVAGINAL TAPE (TVT) PROCEDURE (N/A) mid-uretheral SLING (N/A) CYSTOSCOPY (N/A)  Subjective: Patient reports a little nausea that has resolved.  Excellent pain control.  Has walked and eaten dinner.  Objective: I have reviewed patient's vital signs, intake and output and medications. Vitals:   02/12/17 1415 02/12/17 1651  BP: 117/67 129/71  Pulse: 65 72  Resp: 16 16  Temp: 97.6 F (36.4 C) 98.2 F (36.8 C)  SpO2: 99% 98%     General: alert and cooperative Resp: clear to auscultation bilaterally Cardio: regular rate and rhythm, S1, S2 normal, no murmur, click, rub or gallop GI: soft, non-tender; bowel sounds normal; no masses,  no organomegaly and incision: clean, dry and intact Extremities: extremities normal, atraumatic, no cyanosis or edema Vaginal Bleeding: minimal  Assessment: s/p Procedure(s) with comments: HYSTERECTOMY TOTAL LAPAROSCOPIC (N/A) LAPAROSCOPIC BILATERAL SALPINGO OOPHORECTOMY (Bilateral) - possible TRANSVAGINAL TAPE (TVT) PROCEDURE (N/A) mid-uretheral SLING (N/A) CYSTOSCOPY (N/A): stable and progressing well  Plan: Advance diet Encourage ambulation Advance to PO medication continue ambulation  LOS: 0 days    Lyman Speller 02/12/2017, 6:35 PM

## 2017-02-12 NOTE — Op Note (Addendum)
02/12/2017  10:47 AM  PATIENT:  Emily Montgomery  56 y.o. female  PRE-OPERATIVE DIAGNOSIS:  PMB, enlarged uterus, 7.8 cm uterine fibroid, Stress urinary incontincne  POST-OPERATIVE DIAGNOSIS:  Same  PROCEDURE:  Procedure(s): HYSTERECTOMY TOTAL LAPAROSCOPIC LAPAROSCOPIC BILATERAL SALPINGO OOPHORECTOMY, CYSTOSCOPY TRANSVAGINAL TAPE (TVT) PROCEDURE mid-uretheral SLING CYSTOSCOPY  SURGEON:  Faiza Bansal [name]  ASSISTANTS: Josefa Half   ANESTHESIA:   general  ESTIMATED BLOOD LOSS: 25cc for hysterectomy portion of the procedure, 150cc total including the TVT  BLOOD ADMINISTERED:none   FLUIDS: 2000 cc LR total for procedure  UOP: 300cc clear UOP total for procedure  SPECIMEN:  Uterus (weighed 253gms in the OR), cervix, bilateral fallopian tubes and ovaries  DISPOSITION OF SPECIMEN:  PATHOLOGY  FINDINGS: Enlarged uterus with central fibroid, at least one smaller fibroid present as well, atrophic ovaries, no pelvic adhesions, upper abdomen normal  DESCRIPTION OF OPERATION: Patient is taken to the operating room. She is placed in the supine position. She is a running IV in place. Informed consent was present on the chart. SCDs on her lower extremities and functioning properly. Patient was positioned while she was awake.  Her legs were placed in the low lithotomy position in [place]. Her arms were tucked by the side.  General endotracheal anesthesia was administered by the anesthesia staff without difficulty. Dr. Royce Macadamia, anesthesia, oversaw case.  Time out performed.    Chlora prep was then used to prep the abdomen and Betadine was used to prep the inner thighs, perineum and vagina. Once 3 minutes had past the patient was draped in a normal standard fashion. The legs were lifted to the high lithotomy position. The cervix was visualized by placing a heavy weighted speculum in the posterior aspect of the vagina and using a curved Deaver retractor to the retract anteriorly. The  anterior lip of the cervix was grasped with single-tooth tenaculum.  The uteru sounded to 8 cm. Pratt dilators were used to dilate the cervix up to a #21. A RUMI uterine manipulator was obtained. A #8 disposable tip was placed on the RUMI manipulator as well as a 3.0, silver KOH ring. This was passed through the cervix and the bulb of the disposable tip was inflated with 10 cc of normal saline. There was a good fit of the KOH ring around the cervix. The tenaculum was removed. There was also good manipulation of the uterus. The speculum and retractor were removed as well. A Foley catheter was placed to straight drain.  Clear urine was noted. Legs were lowered to the low lithotomy position and attention was turned the abdomen.  The umbilicus was everted.  A Veress needle was obtained. Syringe of sterile saline was placed on a open Veress needle.  This was passed into the umbilicus until just when the fluid started to drip.  Then low flow CO2 gas was attached the needle and the pneumoperitoneum was achieved without difficulty. Once four liters of gas was in the abdomen the Veress needle was removed and a 5 millimeter non-bladed Optiview trocar and port were passed directly to the abdomen. The laparoscope was then used to confirm intraperitoneal placement. A large uterus with fundal fibroid was noted.  Ovaries were atrophic in appearance.  No adhesions noted.  Locations for RLQ, LLQ, and suprapubic ports were noted by transillumination of the abdominal wall.  0.25% marcaine was used to anesthetize the skin.  21mm skin incision was made in the RLQ and an AirSeal port was placed underdirect visualization of the laparoscope.  Then a 67mm skin incision was made and a 62mm nonbladed trochar and port was placed in the LLQ.  Finally, and 35mm skin incision was made about 4cm above the pubic symphasis and an 63mm non-bladed port was placed with direct visualization of the laparoscope.  All trochars were removed.    Ureters were  identifies.  Attention was turned to the left side. With uterus on stretch, the left IP ligament was serially clamped, cauterized and incised with the Ligasure device.  Then the left round ligament was serially clamped cauterized and incised. The anterior and posterior peritoneum of the inferior leaf of the broad ligament were opened. The beginning of the baldder flap was created.  Because of the size of the uterus, visualization of the uterine artery was difficult at this portion of the procedure.  Attention was turned to the right side.    The uterus was placed on stretch to the opposite side.  The right IP ligament was serially clamped, cauterized and incised using the Ligasure apparatus.  Next the right round ligament was serially clamped cauterized and incised. The anterior posterior peritoneum of the inferiorly for the broad ligament were opened. The anterior peritoneum was carried across to the dissection on the left side. The remainder of the bladder flap was created using sharp dissection and the Harmonic scalpel. The bladder was well below the level of the KOH ring. The left uterine artery skeletonized. Then the left uterine artery, above the level of the KOH ring, was serially clamped cauterized and incised.   Then attention was turned back to the left side.  There was better manipulation of the uterus at this point.  The left uterine artery skeletonized and then just superior to the KOH ring this vessel was serially clamped, cauterized, and incised.  The uterus was devascularized at this point.  The colpotomy was performed by starting in the midline and using a harmonic scalpel with the inferior edge of the open blade  This was carried around the cervix in a circumferential fashion until the vaginal mucosa was completely incised in the specimen was freed.  The specimen was then delivered into the vagina.  It seemed to large to fully deliver through the vagina.  So, attention was turned to the  vagina.  The legs were lifted in the dorsal lithotomy position.  A heavy weighted speculum was placed in the vagina.  The RUMI manipulator and KOH ring were removed.  The cervix was grasped with a single toothed tenaculum.  A "V" shaped incision was made in the posterior aspect of the uterus and the beginning of morcellation was begun.  However, once this incision was made and the cervix removed from the uterus, there was enough decompression of the uterine size to deliver through the vagina.  The large fibroid was never incised with the knife.  A vaginal occlusive device was used to maintain the pneumoperitoneum  Instruments were changed with a needle driver and Kobra graspers.  Using a 9 inch V. lock suture, the cuff was closed by incorporating the anterior and posterior vaginal mucosa in each stitch. This was carried across all the way to the left corner in a running fashion. Two stitches were brought back towards the midline and the suture was cut flush with the vagina. The needle was brought out the pelvis. The pelvis was irrigated. All pedicles were inspected. No bleeding was noted.  Pressure was relieved to watch for any bleeding.  None was noted.  Ureters were noted  to be peristalsing.  Arista was placed in the pelvis for hemostasis.  At this point the procedure was completed.  All instruments were removed.  The pneumoperitoneum was relieved with the airseal device after the suprapubic port and LLQ ports were removed.  The remaining two ports were removed.  The patient was taken out of Trendelenburg positioning.    The skin was then closed with subcuticular stitches of 3-0 Vicryl. The skin was cleansed Dermabond was applied. Attention was then turned the vagina and the cuff was inspected. No bleeding was noted. The anterior posterior vaginal mucosa was incorporated in each stitch. The Foley catheter was removed.  Cystoscopy was performed.  No sutures or bladder injuries were noted.  The entire bladder  was visualized including the bubble at the dome of the baldder.  Ureters were noted and normal urine jets from each one was seen.  Foley was replaced and Dr. Quincy Simmonds was present to proceed with her portion of the procedure.  Sponge, lap, needle, initially counts were correct x2. Patient tolerated this portion of the procedure very well.  Dr. Elza Rafter dictation is separate.    COUNTS:  YES  PLAN OF CARE: Transfer to PACU

## 2017-02-12 NOTE — Transfer of Care (Signed)
Immediate Anesthesia Transfer of Care Note  Patient: Emily Montgomery  Procedure(s) Performed: Procedure(s) with comments: HYSTERECTOMY TOTAL LAPAROSCOPIC (N/A) LAPAROSCOPIC BILATERAL SALPINGO OOPHORECTOMY (Bilateral) - possible TRANSVAGINAL TAPE (TVT) PROCEDURE (N/A) mid-uretheral SLING (N/A) CYSTOSCOPY (N/A)  Patient Location: PACU  Anesthesia Type:General  Level of Consciousness: awake, alert  and oriented  Airway & Oxygen Therapy: Patient Spontanous Breathing and Patient connected to nasal cannula oxygen  Post-op Assessment: Report given to RN, Post -op Vital signs reviewed and stable and Patient moving all extremities X 4  Post vital signs: Reviewed and stable  Last Vitals:  Vitals:   02/12/17 0630  BP: (!) 152/86  Pulse: 73  Resp: 16  Temp: 36.6 C    Last Pain:  Vitals:   02/12/17 0630  TempSrc: Oral      Patients Stated Pain Goal: 3 (01/56/15 3794)  Complications: No apparent anesthesia complications

## 2017-02-12 NOTE — H&P (Signed)
Emily Montgomery is an 56 y.o. female G2P2 MWF here for definitive treatment of symptomatic uterine fibroids and stress urinary incontinence.  Uterus measured 10.2 x 7.8 x 7.0cm with 7.3cm uterine fibroid.  This has been monitored with ultrasounds and has not changed in size.  She has undergone a hysteroscopy for bleeding that showed benign tissue.  Dr. Quincy Simmonds will also be performing a mid-urethral sling as well.  Risks, benefits, alteratives have been discussed.  Pt does desires both ovaries to be removed.    Pertinent Gynecological History: Menses: post-menopausal Bleeding: none since feb Contraception: condoms DES exposure: denies Blood transfusions: none Sexually transmitted diseases: no past history Previous GYN Procedures: hysteroscopy  Last mammogram: normal Date: 10/28/15 Last pap: normal Date: 09/16/16 OB History: G2, P2   Menstrual History: [name] was 06/23/2015.    Past Medical History:  Diagnosis Date  . Family history of adverse reaction to anesthesia    sister- ponv  . Multinodular thyroid    goiter--- per pt consulted w/ endocrinologist, told benign cyst  . PMB (postmenopausal bleeding)   . Thickened endometrium    and endometrial irregularity    Past Surgical History:  Procedure Laterality Date  . COLONOSCOPY WITH PROPOFOL  2015  . DILATATION & CURETTAGE/HYSTEROSCOPY WITH MYOSURE N/A 09/29/2016   Procedure: DILATATION & CURETTAGE/HYSTEROSCOPY, VULVA BIOPSIES;  Surgeon: Megan Salon, MD;  Location: [facility];  Service: Gynecology;  Laterality: N/A;  . TONSILLECTOMY AND ADENOIDECTOMY  child  . WISDOM TOOTH EXTRACTION  20 approx.    Family History  Problem Relation Age of Onset  . Lung disease Father   . Hypertension Mother   . Cancer Sister        hodgekins  . Thyroid cancer Paternal Grandmother   . Thyroid disease Neg Hx     Social History:  reports that she has never smoked. She has never used smokeless tobacco.  She reports that she drinks about 1.2 - 2.4 oz of alcohol per week . She reports that she does not use drugs.  Allergies: No Known Allergies  Prescriptions Prior to Admission  Medication Sig Dispense Refill Last Dose  . BIOTIN PO Take 1 tablet by mouth daily.    Past Month at Unknown time  . Cyanocobalamin (VITAMIN B 12 PO) Take 1 tablet by mouth daily.    Past Month at Unknown time  . Omega-3 Fatty Acids (FISH OIL PO) Take 1 capsule by mouth daily.    Past Month at Unknown time  . ibuprofen (ADVIL,MOTRIN) 200 MG tablet Take 400 mg by mouth every 6 (six) hours as needed for headache or mild pain.    More than a month at Unknown time  . ibuprofen (ADVIL,MOTRIN) 800 MG tablet Take 1 tablet (800 mg total) by mouth every 8 (eight) hours as needed. (Patient not taking: Reported on 02/06/2017) 30 tablet 0 Not Taking  . oxyCODONE-acetaminophen (PERCOCET) 5-325 MG tablet Take 1-2 tablets by mouth every 6 (six) hours as needed. use only as much as needed to relieve pain (Patient not taking: Reported on 02/06/2017) 30 tablet 0 Not Taking    Review of Systems  All other systems reviewed and are negative.   Blood pressure (!) 152/86, pulse 73, temperature 97.9 F (36.6 C), temperature source Oral, resp. rate 16, last menstrual period 06/23/2015. Physical Exam  Constitutional: She is oriented to person, place, and time. She appears well-developed and well-nourished.  Cardiovascular: Normal rate and regular rhythm.   Respiratory: Effort normal  and breath sounds normal.  Neurological: She is alert and oriented to person, place, and time.  Skin: Skin is warm and dry.  Psychiatric: She has a normal mood and affect.    Results for orders placed or performed during the hospital encounter of 02/12/17 (from the past 24 hour(s))  Pregnancy, urine     Status: None   Collection Time: 02/12/17  6:05 AM  Result Value Ref Range   Preg Test, Ur NEGATIVE NEGATIVE    No results found.  Assessment/Plan: 56 yo  G2P2 MWF here for definitive treatment of symptomatic uterine fibroids and stress urinary incontinence.  All questions answered.  Pt ready to proceed.  Hale Bogus [name] 02/12/2017, 7:01 AM

## 2017-02-12 NOTE — OR Nursing (Signed)
SPECIMEN WEIGHT IN OR 253.4 GRAMS

## 2017-02-12 NOTE — Progress Notes (Signed)
Day of Surgery Procedure(s) (LRB): HYSTERECTOMY TOTAL LAPAROSCOPIC (N/A) LAPAROSCOPIC BILATERAL SALPINGO OOPHORECTOMY (Bilateral) TRANSVAGINAL TAPE (TVT) PROCEDURE (N/A) mid-uretheral SLING (N/A) CYSTOSCOPY (N/A)  Subjective: Patient reports vomiting.   Had one episode of emesis.  Was able to eat after this and tolerate po.  Good pain control.   Receiving Toradol now.  Not out of bed yet.   Objective: I have reviewed patient's vital signs and intake and output. Vitals:   02/12/17 1415 02/12/17 1651  BP: 117/67 129/71  Pulse: 65 72  Resp: 16 16  Temp: 97.6 F (36.4 C) 98.2 F (36.8 C)  SpO2: 99% 98%   I/0 - 2500 cc/550 cc + about 700 cc urine in foley bag now.  General: alert and cooperative Resp: clear to auscultation bilaterally Cardio: regular rate and rhythm, S1, S2 normal, no murmur, click, rub or gallop GI: soft, non-tender; bowel sounds normal; no masses,  no organomegaly and incision: clean, dry and intact Extremities: Pas and Ted hose on.  DPs 2+ bilaterally.  Vaginal Bleeding: minimal  Assessment: s/p Procedure(s) with comments: HYSTERECTOMY TOTAL LAPAROSCOPIC (N/A) LAPAROSCOPIC BILATERAL SALPINGO OOPHORECTOMY (Bilateral) - possible TRANSVAGINAL TAPE (TVT) PROCEDURE (N/A) mid-uretheral SLING (N/A) CYSTOSCOPY (N/A): stable  Plan: Continue IVF.  Antiemetics prn.  Will ambulate soon.  Foley overnight.  CBC in am.    LOS: 0 days    Arloa Koh 02/12/2017, 5:37 PM

## 2017-02-12 NOTE — Anesthesia Postprocedure Evaluation (Signed)
Anesthesia Post Note  Patient: Emily Montgomery  Procedure(s) Performed: Procedure(s) (LRB): HYSTERECTOMY TOTAL LAPAROSCOPIC (N/A) LAPAROSCOPIC BILATERAL SALPINGO OOPHORECTOMY (Bilateral) TRANSVAGINAL TAPE (TVT) PROCEDURE (N/A) mid-uretheral SLING (N/A) CYSTOSCOPY (N/A)     Patient location during evaluation: [facility] Anesthesia Type: General Level of consciousness: awake, awake and alert, oriented and patient cooperative Pain management: pain level controlled Vital Signs Assessment: post-procedure vital signs reviewed and stable Respiratory status: spontaneous breathing, nonlabored ventilation, respiratory function stable and patient connected to nasal cannula oxygen Cardiovascular status: stable Postop Assessment: no apparent nausea or vomiting Anesthetic complications: no    Last Vitals:  Vitals:   02/12/17 1255 02/12/17 1415  BP: 137/63 117/67  Pulse: 70 65  Resp:  16  Temp: (!) 36.4 C 36.4 C  SpO2: 97% 99%    Last Pain:  Vitals:   02/12/17 1600  TempSrc:   PainSc: 3    Pain Goal: Patients Stated Pain Goal: 3 (02/12/17 0630)               [name] L

## 2017-02-12 NOTE — Progress Notes (Signed)
Update to History and Physical  No marked change in status since office preop visit.   Lungs CTA bilaterally.  Cor S1S2 RRR.  Labs reviewed.  OK to proceed with surgery.

## 2017-02-12 NOTE — Anesthesia Procedure Notes (Signed)
Procedure Name: Intubation Date/Time: 02/12/2017 7:31 AM Performed by: Casimer Lanius A Pre-anesthesia Checklist: Patient identified, Emergency Drugs available, Suction available and Patient being monitored Patient Re-evaluated:Patient Re-evaluated prior to induction Oxygen Delivery Method: Circle system utilized and Simple face mask Preoxygenation: Pre-oxygenation with 100% oxygen Induction Type: IV induction Ventilation: Mask ventilation without difficulty Laryngoscope Size: Mac and 4 Grade View: Grade II Tube type: Oral Tube size: 7.0 mm Number of attempts: 1 Airway Equipment and Method: Stylet Placement Confirmation: ETT inserted through vocal cords under direct vision,  positive ETCO2 and breath sounds checked- equal and bilateral Secured at: 20 (right lip) cm Tube secured with: Tape Dental Injury: Teeth and Oropharynx as per pre-operative assessment

## 2017-02-13 ENCOUNTER — Other Ambulatory Visit: Payer: Self-pay | Admitting: Obstetrics and Gynecology

## 2017-02-13 ENCOUNTER — Encounter (HOSPITAL_COMMUNITY): Payer: Self-pay | Admitting: Obstetrics & Gynecology

## 2017-02-13 DIAGNOSIS — N950 Postmenopausal bleeding: Secondary | ICD-10-CM | POA: Diagnosis not present

## 2017-02-13 LAB — HEMOGLOBIN: HEMOGLOBIN: 11 g/dL — AB (ref 12.0–15.0)

## 2017-02-13 MED ORDER — CIPROFLOXACIN HCL 250 MG PO TABS: 250.0000 mg | ORAL_TABLET | Freq: Two times a day (BID) | ORAL | 0 refills | Status: AC

## 2017-02-13 MED ORDER — CIPROFLOXACIN HCL 250 MG PO TABS: 250.0000 mg | ORAL_TABLET | Freq: Two times a day (BID) | ORAL | 0 refills | Status: DC

## 2017-02-13 NOTE — Progress Notes (Signed)
1 Day Post-Op Procedure(s) (LRB): HYSTERECTOMY TOTAL LAPAROSCOPIC (N/A) LAPAROSCOPIC BILATERAL SALPINGO OOPHORECTOMY (Bilateral) TRANSVAGINAL TAPE (TVT) PROCEDURE (N/A) mid-uretheral SLING (N/A) CYSTOSCOPY (N/A)  Subjective: Patient reports tolerating PO, + flatus and no problems voiding.   Ambulating independently. No PVR check yet.  Voided 2 times.  Volume looks like about 250 cc in hat.   Objective: I have reviewed patient's vital signs, intake and output and labs. Vitals:   02/12/17 2348 02/13/17 0359  BP: (!) 124/57 121/66  Pulse: 65 64  Resp: 18 18  Temp: 98.9 F (37.2 C) 98.2 F (36.8 C)  SpO2: 99% 99%   I/O - 4500 cc/2050 cc. PVR with [place] now - 540 cc.   Hgb 11.  General: alert and cooperative Resp: clear to auscultation bilaterally Cardio: regular rate and rhythm, S1, S2 normal, no murmur, click, rub or gallop GI: soft, non-tender; bowel sounds normal; no masses,  no organomegaly and incision: clean, dry and intact Vaginal Bleeding: minimal  Assessment: s/p Procedure(s) with comments: HYSTERECTOMY TOTAL LAPAROSCOPIC (N/A) LAPAROSCOPIC BILATERAL SALPINGO OOPHORECTOMY (Bilateral) - possible TRANSVAGINAL TAPE (TVT) PROCEDURE (N/A) mid-uretheral SLING (N/A) CYSTOSCOPY (N/A): urinary retention  Plan: continue voiding trials.   Will determine if needs catheter for D/C or not. Anticipate D/C later this am or early pm. D/C instructions reviewed.  Precautions given.  She already has Rx for Percocet and Ibuprofen.  She will follow up in 6 days.   LOS: 0 days    Arloa Koh 02/13/2017, 8:14 AM

## 2017-02-13 NOTE — Progress Notes (Signed)
1 Day Post-Op Procedure(s) (LRB): HYSTERECTOMY TOTAL LAPAROSCOPIC (N/A) LAPAROSCOPIC BILATERAL SALPINGO OOPHORECTOMY (Bilateral) TRANSVAGINAL TAPE (TVT) PROCEDURE (N/A) mid-uretheral SLING (N/A) CYSTOSCOPY (N/A)  Subjective: Patient reports excellent pain control.  Never took any narcotic yesterday. No nausea.  Has eaten.  Walked three times last night.  Has voided 150cc this am.   Objective: I have reviewed patient's vital signs, intake and output, medications and labs. Hb: 11.0  General: alert and cooperative Resp: clear to auscultation bilaterally Cardio: regular rate and rhythm, S1, S2 normal, no murmur, click, rub or gallop GI: soft, non-tender; bowel sounds normal; no masses,  no organomegaly and incision: clean, dry and intact Extremities: extremities normal, atraumatic, no cyanosis or edema Vaginal Bleeding: minimal  Assessment: s/p Procedure(s) with comments: HYSTERECTOMY TOTAL LAPAROSCOPIC (N/A) LAPAROSCOPIC BILATERAL SALPINGO OOPHORECTOMY (Bilateral) - possible TRANSVAGINAL TAPE (TVT) PROCEDURE (N/A) mid-uretheral SLING (N/A) CYSTOSCOPY (N/A): progressing well  Plan: voiding trials on going this am  Hopeful discharge later  LOS: 0 days    Emily Montgomery Emily Montgomery 02/13/2017, 7:26 AM

## 2017-02-13 NOTE — Discharge Instructions (Signed)
Total Laparoscopic Hysterectomy, Care After Refer to this sheet in the next few weeks. These instructions provide you with information on caring for yourself after your procedure. Your health care provider may also give you more specific instructions. Your treatment has been planned according to current medical practices, but problems sometimes occur. Call your health care provider if you have any problems or questions after your procedure. What can I expect after the procedure?  Pain and bruising at the incision sites. You will be given pain medicine to control it.  Menopausal symptoms such as hot flashes, night sweats, and insomnia if your ovaries were removed.  Sore throat from the breathing tube that was inserted during surgery. Follow these instructions at home:            DO not place anything in the vagina for at least 8 weeks.   Only take over-the-counter or prescription medicines for pain, discomfort, or fever as directed by your health care provider.  Do not take aspirin. It can cause bleeding.  Do not drive when taking pain medicine.  Follow your health care provider's advice regarding diet, exercise, lifting, driving, and general activities.  Resume your usual diet as directed and allowed.  Get plenty of rest and sleep.  Do not douche, use tampons, or have sexual intercourse for at least 6 weeks, or until your health care provider gives you permission.  Change your bandages (dressings) as directed by your health care provider.  Monitor your temperature and notify your health care provider of a fever.  Take showers instead of baths for 2-3 weeks.  Do not drink alcohol until your health care provider gives you permission.  If you develop constipation, you may take a mild laxative with your health care provider's permission. Bran foods may help with constipation problems. Drinking enough fluids to keep your urine clear or pale yellow may help as well.  Try to have someone  home with you for 1-2 weeks to help around the house.  Keep all of your follow-up appointments as directed by your health care provider. Contact a health care provider if:  You have swelling, redness, or increasing pain around your incision sites.  You have pus coming from your incision.  You notice a bad smell coming from your incision.  Your incision breaks open.  You feel dizzy or lightheaded.  You have pain or bleeding when you urinate.  You have persistent diarrhea.  You have persistent nausea and vomiting.  You have abnormal vaginal discharge.  You have a rash.  You have any type of abnormal reaction or develop an allergy to your medicine.  You have poor pain control with your prescribed medicine. Get help right away if:  You have chest pain or shortness of breath.  You have severe abdominal pain that is not relieved with pain medicine.  You have pain or swelling in your legs. This information is not intended to replace advice given to you by your health care provider. Make sure you discuss any questions you have with your health care provider. Document Released: 02/26/2013 Document Revised: 10/14/2015 Document Reviewed: 11/26/2012 Elsevier Interactive Patient Education  2017 [employer]  Urethral Vaginal Sling, Care After Refer to this sheet in the next few weeks. These instructions provide you with information on caring for yourself after your procedure. Your health care provider may also give you more specific instructions. Your treatment has been planned according to current medical practices, but problems sometimes occur. Call your health care provider  if you have any problems or questions after your procedure. What can I expect after the procedure? After your procedure, it is typical to have the following:  A catheter in your bladder until your bladder is able to work on its own properly. You will be instructed on how to empty the catheter bag.  Absorbable  stitches in your incisions. They will slowly dissolve over 1-2 months.  Follow these instructions at home:  Get plenty of rest.  Only take over-the-counter or prescription medicines as directed by your health care provider. Do not take aspirin because it can cause bleeding.  Do not take baths. Take showers until your health care provider tells you otherwise.  You may resume your usual diet. Eat a well-balanced diet.  Drink enough fluids to keep your urine clear or pale yellow.  Limit exercise and activities as directed by your health care provider. Do not lift anything heavier than 10 pounds for 8 weeks.  Do not douche, use tampons, or have sexual intercourse for 8 weeks after your procedure.  Follow up with your health care provider as directed. Contact a health care provider if:  You have a heavy or bad smelling vaginal discharge.  You have a rash.  You have pain that is not controlled with medicines.  You have lightheadedness or feel faint. Get help right away if:  You have a fever.  You have vaginal bleeding.  You faint.  You have shortness of breath.  You have chest, abdominal, or leg pain.  You have pain when urinating or cannot urinate.  Your catheter is still in your bladder and becomes blocked.  You have swelling, redness, and pain in the vaginal area. This information is not intended to replace advice given to you by your health care provider. Make sure you discuss any questions you have with your health care provider. Document Released: 02/26/2013 Document Revised: 10/14/2015 Document Reviewed: 10/25/2012 Elsevier Interactive Patient Education  [employer].

## 2017-02-19 ENCOUNTER — Encounter: Payer: Self-pay | Admitting: Obstetrics and Gynecology

## 2017-02-19 ENCOUNTER — Ambulatory Visit (INDEPENDENT_AMBULATORY_CARE_PROVIDER_SITE_OTHER): Payer: BLUE CROSS/BLUE SHIELD | Admitting: Obstetrics and Gynecology

## 2017-02-19 VITALS — BP 120/76 | HR 80 | Resp 16 | Wt 131.0 lb

## 2017-02-19 DIAGNOSIS — Z9889 Other specified postprocedural states: Secondary | ICD-10-CM

## 2017-02-19 NOTE — Progress Notes (Signed)
GYNECOLOGY  VISIT   HPI: 56 y.o.   Married  Caucasian  female   G2P2002 with [name] was 06/23/2015.   here for  1 week post op HYSTERECTOMY TOTAL LAPAROSCOPIC (N/A Abdomen); LAPAROSCOPIC BILATERAL SALPINGO OOPHORECTOMY (Bilateral Abdomen); TRANSVAGINAL TAPE (TVT) PROCEDURE (N/A ); mid-uretheral SLING (N/A ); CYSTOSCOPY (N/A )   Ambulating.  Left the house yesterday.  Good energy.   Tender suprapubically.  Taking ibuprofen but not sure if she really needs it.  Stopped Percocet after 3 - 4 nights.   Having BMs.   Spotting only.   Voiding well.   Last void at hospital was 900 cc and had residual of 200 cc.  Left without catheter.  Works as a Secretary/administrator.   GYNECOLOGIC HISTORY: [name] was 06/23/2015. Contraception:  Hysterectomy Menopausal hormone therapy:  none Last mammogram: 10/28/15 BIRADS 2 benign  Last pap smear:   09/15/16 negative, HR HPV negative, 09/10/14 negative         OB History    Gravida Para Term Preterm AB Living   2 2 2  0 0 2   SAB TAB Ectopic Multiple Live Births   0 0 0 0 2         Patient Active Problem List   Diagnosis Date Noted  . Enlarged uterus 02/12/2017  . Multinodular goiter 09/25/2014    Past Medical History:  Diagnosis Date  . Family history of adverse reaction to anesthesia    sister- ponv  . Multinodular thyroid    goiter--- per pt consulted w/ endocrinologist, told benign cyst  . PMB (postmenopausal bleeding)   . Thickened endometrium    and endometrial irregularity    Past Surgical History:  Procedure Laterality Date  . BLADDER SUSPENSION N/A 02/12/2017   Procedure: TRANSVAGINAL TAPE (TVT) PROCEDURE;  Surgeon: Nunzio Cobbs, MD;  Location: [facility];  Service: Gynecology;  Laterality: N/A;  . COLONOSCOPY WITH PROPOFOL  2015  . CYSTOSCOPY N/A 02/12/2017   Procedure: CYSTOSCOPY;  Surgeon: Nunzio Cobbs, MD;  Location: [facility];  Service: Gynecology;   Laterality: N/A;  . DILATATION & CURETTAGE/HYSTEROSCOPY WITH MYOSURE N/A 09/29/2016   Procedure: DILATATION & CURETTAGE/HYSTEROSCOPY, VULVA BIOPSIES;  Surgeon: Megan Salon, MD;  Location: [facility];  Service: Gynecology;  Laterality: N/A;  . LAPAROSCOPIC BILATERAL SALPINGO OOPHERECTOMY Bilateral 02/12/2017   Procedure: LAPAROSCOPIC BILATERAL SALPINGO OOPHORECTOMY;  Surgeon: Megan Salon, MD;  Location: [facility];  Service: Gynecology;  Laterality: Bilateral;  possible  . LAPAROSCOPIC HYSTERECTOMY N/A 02/12/2017   Procedure: HYSTERECTOMY TOTAL LAPAROSCOPIC;  Surgeon: Megan Salon, MD;  Location: [facility];  Service: Gynecology;  Laterality: N/A;  . PUBOVAGINAL SLING N/A 02/12/2017   Procedure: mid-uretheral SLING;  Surgeon: Nunzio Cobbs, MD;  Location: [facility];  Service: Gynecology;  Laterality: N/A;  . TONSILLECTOMY AND ADENOIDECTOMY  child  . WISDOM TOOTH EXTRACTION  20 approx.    Current Outpatient Prescriptions  Medication Sig Dispense Refill  . ciprofloxacin (CIPRO) 250 MG tablet Take 1 tablet (250 mg total) by mouth 2 (two) times daily. Take during the time the catheter is in your bladder. 20 tablet 0  . ibuprofen (ADVIL,MOTRIN) 800 MG tablet Take 1 tablet (800 mg total) by mouth every 8 (eight) hours as needed. 30 tablet 0  . oxyCODONE-acetaminophen (PERCOCET) 5-325 MG tablet Take 1-2 tablets by mouth every 6 (six) hours as needed. use only as much as needed to relieve pain  30 tablet 0  . BIOTIN PO Take 1 tablet by mouth daily.     . Cyanocobalamin (VITAMIN B 12 PO) Take 1 tablet by mouth daily.      No current facility-administered medications for this visit.      ALLERGIES: Patient has no known allergies.  Family History  Problem Relation Age of Onset  . Lung disease Father   . Hypertension Mother   . Cancer Sister        hodgekins  . Thyroid cancer Paternal Grandmother   . Thyroid disease Neg Hx     Social History   Social History  . Marital  status: Married    Spouse name: N/A  . Number of children: N/A  . Years of education: N/A   Occupational History  . Not on file.   Social History Main Topics  . Smoking status: Never Smoker  . Smokeless tobacco: Never Used  . Alcohol use 1.2 - 2.4 oz/week    2 - 4 Standard drinks or equivalent per week     Comment: occasional  . Drug use: No  . Sexual activity: Yes    Partners: Male    Birth control/ protection: Post-menopausal   Other Topics Concern  . Not on file   Social History Narrative  . No narrative on file    ROS:  Pertinent items are noted in HPI.  PHYSICAL EXAMINATION:    BP 120/76 (BP Location: Right Arm, Patient Position: Sitting, Cuff Size: Normal)   Pulse 80   Resp 16   Wt 131 lb (59.4 kg)   LMP 06/23/2015   BMI 23.58 kg/m     General appearance: alert, cooperative and appears stated age   Abdomen: incisions intact.  Midline incision with small amount of induration.  Abdomen is soft, non-tender, no masses,  no organomegaly   Pelvic: External genitalia:   SP incisions intact.  Left one with ecchymoses and minimal induration.  Eschar noted.     Chaperone was present for exam.  ASSESSMENT  Doing well post op following laparoscopic hysterectomy with BSO and TVT/cysto.   PLAN  Final pathology report reviewed with patient.  Discussed to heavy exercise and nothing per vagina for 8 weeks post. Follow up for post with Dr. Sabra Heck in one month.  Final post op visit in 2 months.    An After Visit Summary was printed and given to the patient.

## 2017-03-26 ENCOUNTER — Encounter: Payer: Self-pay | Admitting: Obstetrics & Gynecology

## 2017-03-26 ENCOUNTER — Ambulatory Visit (INDEPENDENT_AMBULATORY_CARE_PROVIDER_SITE_OTHER): Payer: BLUE CROSS/BLUE SHIELD | Admitting: Obstetrics & Gynecology

## 2017-03-26 VITALS — BP 110/60 | HR 84 | Resp 16 | Wt 132.6 lb

## 2017-03-26 DIAGNOSIS — Z9889 Other specified postprocedural states: Secondary | ICD-10-CM

## 2017-03-26 NOTE — Progress Notes (Signed)
Post Operative Visit  Procedure:HYSTERECTOMY TOTAL LAPAROSCOPIC (N/A Abdomen); LAPAROSCOPIC BILATERAL SALPINGO OOPHORECTOMY (Bilateral Abdomen); TRANSVAGINAL TAPE (TVT) PROCEDURE (N/A ); mid-uretheral SLING (N/A ); CYSTOSCOPY (N/A )  Days Post-op: 6 weeks  Subjective: Patient states that she is doing well. No bleeding or other problems.  Denies bleeding after the first few days--which was just spotting.  Off all pain medication.  Bowel and bladder function is back to normal.  Denies urinary leakage.  Has appt with Dr. Quincy Simmonds in two weeks. Feels like she is fully emptying her bladder.  Objective: BP 110/60 (BP Location: Right Arm, Patient Position: Sitting, Cuff Size: Normal)   Pulse 84   Resp 16   Wt 132 lb 9.6 oz (60.1 kg)   LMP 06/23/2015   BMI 23.87 kg/m   EXAM General: alert and no distress Resp: clear to auscultation bilaterally Cardio: regular rate and rhythm, S1, S2 normal, no murmur, click, rub or gallop GI: soft, non-tender; bowel sounds normal; no masses,  no organomegaly and incision: clean, dry and intact Extremities: extremities normal, atraumatic, no cyanosis or edema Vaginal Bleeding: none  Assessment: s/p TLH/BSO, mid-urethral sling, cystocele  Plan: Recheck 2 weeks with Dr. Quincy Simmonds Pelvic rest recommended for two more weeks. No heavy lifting for 12 full weeks discussed.

## 2017-03-26 NOTE — Progress Notes (Signed)
Thank you for caring for her

## 2017-04-06 ENCOUNTER — Telehealth: Payer: Self-pay | Admitting: Obstetrics & Gynecology

## 2017-04-06 ENCOUNTER — Encounter: Payer: Self-pay | Admitting: Obstetrics and Gynecology

## 2017-04-06 ENCOUNTER — Ambulatory Visit (INDEPENDENT_AMBULATORY_CARE_PROVIDER_SITE_OTHER): Payer: BLUE CROSS/BLUE SHIELD | Admitting: Obstetrics and Gynecology

## 2017-04-06 VITALS — BP 120/70 | HR 76 | Ht 62.5 in | Wt 132.0 lb

## 2017-04-06 DIAGNOSIS — Z9889 Other specified postprocedural states: Secondary | ICD-10-CM

## 2017-04-06 NOTE — Progress Notes (Signed)
GYNECOLOGY  VISIT   HPI: 56 y.o.   Married  Caucasian  female   G2P2002 with [name] was 06/23/2015.   here for 7 week follow up HYSTERECTOMY TOTAL LAPAROSCOPIC (N/A Abdomen) LAPAROSCOPIC BILATERAL SALPINGO OOPHORECTOMY (Bilateral Abdomen) TRANSVAGINAL TAPE (TVT) PROCEDURE (N/A ) mid-uretheral SLING (N/A ) CYSTOSCOPY (N/A )  Not wearing any pads for urinary continence anymore.  Can cough and not having any leakage. Voiding well.  No vaginal bleeding or drainage.     GYNECOLOGIC HISTORY: [name] was 06/23/2015. Contraception: Hysterectomy Menopausal hormone therapy:  none Last mammogram:  10/28/15 BIRADS 2 benign  Last pap smear:  09/15/16 negative, HR HPV negative, 09/10/14 negative         OB History    Gravida Para Term Preterm AB Living   2 2 2  0 0 2   SAB TAB Ectopic Multiple Live Births   0 0 0 0 2         Patient Active Problem List   Diagnosis Date Noted  . Enlarged uterus 02/12/2017  . Multinodular goiter 09/25/2014    Past Medical History:  Diagnosis Date  . Family history of adverse reaction to anesthesia    sister- ponv  . Multinodular thyroid    goiter--- per pt consulted w/ endocrinologist, told benign cyst  . PMB (postmenopausal bleeding)   . Thickened endometrium    and endometrial irregularity    Past Surgical History:  Procedure Laterality Date  . BLADDER SUSPENSION N/A 02/12/2017   Procedure: TRANSVAGINAL TAPE (TVT) PROCEDURE;  Surgeon: Nunzio Cobbs, MD;  Location: [facility];  Service: Gynecology;  Laterality: N/A;  . COLONOSCOPY WITH PROPOFOL  2015  . CYSTOSCOPY N/A 02/12/2017   Procedure: CYSTOSCOPY;  Surgeon: Nunzio Cobbs, MD;  Location: [facility];  Service: Gynecology;  Laterality: N/A;  . DILATATION & CURETTAGE/HYSTEROSCOPY WITH MYOSURE N/A 09/29/2016   Procedure: DILATATION & CURETTAGE/HYSTEROSCOPY, VULVA BIOPSIES;  Surgeon: Megan Salon, MD;  Location: [facility];  Service: Gynecology;  Laterality: N/A;  . LAPAROSCOPIC BILATERAL SALPINGO OOPHERECTOMY Bilateral 02/12/2017   Procedure: LAPAROSCOPIC BILATERAL SALPINGO OOPHORECTOMY;  Surgeon: Megan Salon, MD;  Location: [facility];  Service: Gynecology;  Laterality: Bilateral;  possible  . LAPAROSCOPIC HYSTERECTOMY N/A 02/12/2017   Procedure: HYSTERECTOMY TOTAL LAPAROSCOPIC;  Surgeon: Megan Salon, MD;  Location: [facility];  Service: Gynecology;  Laterality: N/A;  . PUBOVAGINAL SLING N/A 02/12/2017   Procedure: mid-uretheral SLING;  Surgeon: Nunzio Cobbs, MD;  Location: [facility];  Service: Gynecology;  Laterality: N/A;  . TONSILLECTOMY AND ADENOIDECTOMY  child  . WISDOM TOOTH EXTRACTION  20 approx.    Current Outpatient Medications  Medication Sig Dispense Refill  . BIOTIN PO Take 1 tablet by mouth daily.     . chlorpheniramine-HYDROcodone (TUSSIONEX) 10-8 MG/5ML SUER Take 5 mLs at bedtime by mouth.    . Cyanocobalamin (VITAMIN B 12 PO) Take 1 tablet by mouth daily.     . predniSONE (DELTASONE) 20 MG tablet Take 20 mg by mouth.     No current facility-administered medications for this visit.      ALLERGIES: Patient has no known allergies.  Family History  Problem Relation Age of Onset  . Lung disease Father   . Hypertension Mother   . Cancer Sister        hodgekins  . Thyroid cancer Paternal Grandmother   . Thyroid disease Neg Hx     Social  History   Socioeconomic History  . Marital status: Married    Spouse name: Not on file  . Number of children: Not on file  . Years of education: Not on file  . Highest education level: Not on file  Social Needs  . Financial resource strain: Not on file  . Food insecurity - worry: Not on file  . Food insecurity - inability: Not on file  . Transportation needs - medical: Not on file  . Transportation needs - non-medical: Not on file  Occupational History  . Not on file  Tobacco Use  . Smoking status: Never Smoker  . Smokeless  tobacco: Never Used  Substance and Sexual Activity  . Alcohol use: Yes    Alcohol/week: 1.2 - 2.4 oz    Types: 2 - 4 Standard drinks or equivalent per week    Comment: occasional  . Drug use: No  . Sexual activity: Yes    Partners: Male    Birth control/protection: Post-menopausal, Surgical    Comment: Hysterectomy  Other Topics Concern  . Not on file  Social History Narrative  . Not on file    ROS:  Pertinent items are noted in HPI.  PHYSICAL EXAMINATION:    BP 120/70 (BP Location: Right Arm, Patient Position: Sitting, Cuff Size: Normal)   Pulse 76   Ht 5' 2.5" (1.588 m)   Wt 132 lb (59.9 kg)   LMP 06/23/2015   BMI 23.76 kg/m     General appearance: alert, cooperative and appears stated age   Abdomen: incisions intact, soft, non-tender, no masses,  no organomegaly   Pelvic: External genitalia:  SP incisions intact.              Urethra:  normal appearing urethra with no masses, tenderness or lesions              Bartholins and Skenes: normal                 Vagina:  sling protected - not visible or palpable              Cervix:  Absent.  Cuff intact.  Suture line still visible                Bimanual Exam:  Uterus:   Absent.  Suture line palpable.              Adnexa: no mass, fullness, tenderness                 Chaperone was present for exam.  ASSESSMENT  Doing well status post TVT Exact midurethral sling/cystoscopy.  Vaginal cuff suture line still healing from hysterectomy.    PLAN  Continue decreased activity for 2 more weeks - no intercourse or heavy exercise/lifting.  Recheck with Dr. Sabra Heck in 2 weeks.   An After Visit Summary was printed and given to the patient.

## 2017-04-06 NOTE — Telephone Encounter (Signed)
Spoke with patient. Appointment scheduled for 04/20/2017 at 9:30 am with Dr.Miller. Patient is agreeable to date and time.   Routing to provider for final review. Patient agreeable to disposition. Will close encounter.

## 2017-04-06 NOTE — Telephone Encounter (Signed)
Patient was seen today with Dr.Silva for a 12 week post op. Patient was told to follow up Friday 04/20/17 with Dr.Miller. To triage to assist with scheduling. Patient would prefer early am or late pm if possible. Patient asked that you please just leave the appointment information on her voicemail.

## 2017-04-16 ENCOUNTER — Ambulatory Visit: Payer: BLUE CROSS/BLUE SHIELD | Admitting: Obstetrics and Gynecology

## 2017-04-19 NOTE — Progress Notes (Signed)
Post Operative Visit  Procedure: Hysterectomy total laparoscopic. Laparoscopic bilateral salpingo oophorectomy. Transvaginal tape procedure. Mid urethral sling. Cystoscopy.  Days Post-op: 67  Subjective: Here for recheck after being seen by Dr. Quincy Simmonds.  Denies vaginal bleeding or pain.  No vaginal discharge.  Has not been SA yet.  Voiding normally.  Having regular bowel movements.    Objective: BP 118/60 (BP Location: Right Arm, Patient Position: Sitting, Cuff Size: Normal)   Pulse 72   Resp 16   Wt 130 lb (59 kg)   LMP 06/23/2015   BMI 23.40 kg/m   EXAM General: alert, cooperative and no distress GI: soft, non-tender; bowel sounds normal; no masses,  no organomegaly and incision: clean, dry and intact Extremities: extremities normal, atraumatic, no cyanosis or edema  Gyn:  NAEFG, vagina pink without lesions except for small area of granulation tissue in midline at cuff Vaginal Bleeding: none  Assessment: s/p TLH/BSO/cystoscopy, mid urethral sling, cystoscopy Granulation tissue at mid position right along vaginal cuff  Plan: Recheck 2 weeks Will start vaginal estrogen cream vaginally twice weekly.  Risks, benefits, alternatives reviewed.

## 2017-04-20 ENCOUNTER — Other Ambulatory Visit: Payer: Self-pay

## 2017-04-20 ENCOUNTER — Ambulatory Visit (INDEPENDENT_AMBULATORY_CARE_PROVIDER_SITE_OTHER): Payer: BLUE CROSS/BLUE SHIELD | Admitting: Obstetrics & Gynecology

## 2017-04-20 ENCOUNTER — Encounter: Payer: Self-pay | Admitting: Obstetrics & Gynecology

## 2017-04-20 VITALS — BP 118/60 | HR 72 | Resp 16 | Wt 130.0 lb

## 2017-04-20 DIAGNOSIS — Z9889 Other specified postprocedural states: Secondary | ICD-10-CM

## 2017-04-20 MED ORDER — ESTRADIOL 0.1 MG/GM VA CREA: TOPICAL_CREAM | VAGINAL | 0 refills | Status: DC

## 2017-05-04 ENCOUNTER — Ambulatory Visit (INDEPENDENT_AMBULATORY_CARE_PROVIDER_SITE_OTHER): Payer: BLUE CROSS/BLUE SHIELD | Admitting: Obstetrics & Gynecology

## 2017-05-04 ENCOUNTER — Encounter: Payer: Self-pay | Admitting: Obstetrics & Gynecology

## 2017-05-04 ENCOUNTER — Other Ambulatory Visit: Payer: Self-pay

## 2017-05-04 VITALS — BP 130/82 | HR 66 | Ht 62.5 in | Wt 135.0 lb

## 2017-05-04 DIAGNOSIS — Z9889 Other specified postprocedural states: Secondary | ICD-10-CM

## 2017-05-04 NOTE — Progress Notes (Signed)
Post Operative Visit  Procedure:TLH/BSO, TVT placement, cystoscopy Days Post-op: 9 weeks  Subjective: Doing well.  Denies vaginal bleeding.  Here for recheck after small portion of suture still present at last visit with Dr. Quincy Simmonds.  Bowel and bladder function is normal.  Denies pain medication use.  Objective: BP 130/82 (BP Location: Right Arm, Patient Position: Sitting, Cuff Size: Normal)   Pulse 66   Ht 5' 2.5" (1.588 m)   Wt 135 lb (61.2 kg)   LMP 06/23/2015   BMI 24.30 kg/m   EXAM General: alert and cooperative GI: soft, non-tender; bowel sounds normal; no masses,  no organomegaly and incision: clean, dry and intact Extremities: extremities normal, atraumatic, no cyanosis or edema Vaginal Bleeding: none  GYN: NAEFG, cuff healing well, tiny area of granulation tissue, no masses/fullness, cannot feel sutures  Assessment: s/p TLH/BSO, cystoscopy, TVT  Plan: Pt may resume SA.  Will continue weekly to biweekly vaginal estrogen.  Pt knows to call if has any vaginal bleeding.

## 2017-05-07 ENCOUNTER — Encounter: Payer: Self-pay | Admitting: Certified Nurse Midwife

## 2017-05-09 ENCOUNTER — Ambulatory Visit: Payer: BLUE CROSS/BLUE SHIELD | Admitting: Obstetrics and Gynecology

## 2017-05-17 ENCOUNTER — Other Ambulatory Visit: Payer: Self-pay | Admitting: Obstetrics & Gynecology

## 2017-09-19 ENCOUNTER — Ambulatory Visit: Payer: BLUE CROSS/BLUE SHIELD | Admitting: Certified Nurse Midwife

## 2017-09-21 ENCOUNTER — Ambulatory Visit: Payer: BLUE CROSS/BLUE SHIELD | Admitting: Endocrinology

## 2017-09-21 DIAGNOSIS — Z0289 Encounter for other administrative examinations: Secondary | ICD-10-CM

## 2017-10-26 ENCOUNTER — Ambulatory Visit (INDEPENDENT_AMBULATORY_CARE_PROVIDER_SITE_OTHER): Payer: BLUE CROSS/BLUE SHIELD | Admitting: Endocrinology

## 2017-10-26 ENCOUNTER — Encounter: Payer: Self-pay | Admitting: Endocrinology

## 2017-10-26 VITALS — BP 124/80 | HR 69 | Wt 133.0 lb

## 2017-10-26 DIAGNOSIS — E042 Nontoxic multinodular goiter: Secondary | ICD-10-CM

## 2017-10-26 LAB — TSH: TSH: 0.62 u[IU]/mL (ref 0.35–4.50)

## 2017-10-26 NOTE — Progress Notes (Signed)
Subjective:    Patient ID: Emily Montgomery, female    DOB: 05-11-1961, 57 y.o.   MRN: [id number]  HPI Pt returns for f/u of multinodular goiter, with largest nodule being a cyst (dx'ed early 2016; bx in 2016 showed cystic fluid--Beth Cat 1; f/u US in 2017 was unchanged; she has been euthyroid off any rx). She does not notice the nodule.  Past Medical History:  Diagnosis Date  . Family history of adverse reaction to anesthesia    sister- ponv  . Multinodular thyroid    goiter--- per pt consulted w/ endocrinologist, told benign cyst  . PMB (postmenopausal bleeding)   . Thickened endometrium    and endometrial irregularity    Past Surgical History:  Procedure Laterality Date  . BLADDER SUSPENSION N/A 02/12/2017   Procedure: TRANSVAGINAL TAPE (TVT) PROCEDURE;  Surgeon: Nunzio Cobbs, MD;  Location: [facility];  Service: Gynecology;  Laterality: N/A;  . COLONOSCOPY WITH PROPOFOL  2015  . CYSTOSCOPY N/A 02/12/2017   Procedure: CYSTOSCOPY;  Surgeon: Nunzio Cobbs, MD;  Location: [facility];  Service: Gynecology;  Laterality: N/A;  . DILATATION & CURETTAGE/HYSTEROSCOPY WITH MYOSURE N/A 09/29/2016   Procedure: DILATATION & CURETTAGE/HYSTEROSCOPY, VULVA BIOPSIES;  Surgeon: Megan Salon, MD;  Location: [facility];  Service: Gynecology;  Laterality: N/A;  . LAPAROSCOPIC BILATERAL SALPINGO OOPHERECTOMY Bilateral 02/12/2017   Procedure: LAPAROSCOPIC BILATERAL SALPINGO OOPHORECTOMY;  Surgeon: Megan Salon, MD;  Location: [facility];  Service: Gynecology;  Laterality: Bilateral;  possible  . LAPAROSCOPIC HYSTERECTOMY N/A 02/12/2017   Procedure: HYSTERECTOMY TOTAL LAPAROSCOPIC;  Surgeon: Megan Salon, MD;  Location: [facility];  Service: Gynecology;  Laterality: N/A;  . PUBOVAGINAL SLING N/A 02/12/2017   Procedure: mid-uretheral SLING;  Surgeon: Nunzio Cobbs, MD;  Location: [facility];  Service: Gynecology;  Laterality: N/A;  . TONSILLECTOMY AND ADENOIDECTOMY  child   . WISDOM TOOTH EXTRACTION  20 approx.    Social History   Socioeconomic History  . Marital status: Married    Spouse name: Not on file  . Number of children: Not on file  . Years of education: Not on file  . Highest education level: Not on file  Occupational History  . Not on file  Social Needs  . Financial resource strain: Not on file  . Food insecurity:    Worry: Not on file    Inability: Not on file  . Transportation needs:    Medical: Not on file    Non-medical: Not on file  Tobacco Use  . Smoking status: Never Smoker  . Smokeless tobacco: Never Used  Substance and Sexual Activity  . Alcohol use: Yes    Alcohol/week: 1.2 - 2.4 oz    Types: 2 - 4 Standard drinks or equivalent per week    Comment: occasional  . Drug use: No  . Sexual activity: Yes    Partners: Male    Birth control/protection: Post-menopausal, Surgical    Comment: Hysterectomy  Lifestyle  . Physical activity:    Days per week: 0 days    Minutes per session: 0 min  . Stress: Not at all  Relationships  . Social connections:    Talks on phone: Not on file    Gets together: Not on file    Attends religious service: Not on file    Active member of club or organization: Not on file    Attends meetings of clubs or organizations: Not on file  Relationship status: Not on file  . Intimate partner violence:    Fear of current or ex partner: Not on file    Emotionally abused: Not on file    Physically abused: Not on file    Forced sexual activity: Not on file  Other Topics Concern  . Not on file  Social History Narrative  . Not on file    Current Outpatient Medications on File Prior to Visit  Medication Sig Dispense Refill  . BIOTIN PO Take 1 tablet by mouth daily.     . Cyanocobalamin (VITAMIN B 12 PO) Take 1 tablet by mouth daily.     Marland Kitchen estradiol (ESTRACE) 0.1 MG/GM vaginal cream 1 gram vaginally twice weekly (Patient not taking: Reported on 10/26/2017) 42.5 g 0   No current  facility-administered medications on file prior to visit.     No Known Allergies  Family History  Problem Relation Age of Onset  . Lung disease Father   . Hypertension Mother   . Cancer Sister        hodgekins  . Thyroid cancer Paternal Grandmother   . Thyroid disease Neg Hx     BP 124/80   Pulse 69   Wt 133 lb (60.3 kg)   LMP 06/23/2015   SpO2 98%   BMI 23.94 kg/m   Review of Systems No neck pain.      Objective:   Physical Exam VITAL SIGNS:  See vs page GENERAL: no distress NECK: 2 cm right thyroid nodule is palpable.  There is a longitudonal 2 cm left nodule also.    Lab Results  Component Value Date   TSH 0.62 10/26/2017   T4TOTAL 9.4 09/10/2014      Assessment & Plan:  Multinodular goiter: due for recheck.   Borderline low TSH: I told pt she will develop hyperthyroidism with time.    Patient Instructions  Let's recheck the ultrasound.  you will receive a phone call, about a day and time for an appointment.    A thyroid blood test is requested for you today.  We'll let you know about the results. Please return in 1 year.

## 2017-10-26 NOTE — Patient Instructions (Addendum)
Let's recheck the ultrasound.  you will receive a phone call, about a day and time for an appointment.    A thyroid blood test is requested for you today.  We'll let you know about the results. Please return in 1 year.

## 2017-11-04 DIAGNOSIS — J219 Acute bronchiolitis, unspecified: Secondary | ICD-10-CM | POA: Diagnosis not present

## 2017-11-15 ENCOUNTER — Ambulatory Visit
Admission: RE | Admit: 2017-11-15 | Discharge: 2017-11-15 | Disposition: A | Discharge status: Home / Self Care | Payer: BLUE CROSS/BLUE SHIELD | Source: Ambulatory Visit | Attending: Endocrinology | Admitting: Endocrinology

## 2017-11-15 ENCOUNTER — Telehealth: Payer: Self-pay | Admitting: Endocrinology

## 2017-11-15 DIAGNOSIS — E042 Nontoxic multinodular goiter: Secondary | ICD-10-CM

## 2017-11-15 NOTE — Telephone Encounter (Signed)
Patient is call for her test result, please advise

## 2017-11-15 NOTE — Telephone Encounter (Signed)
I called and went over results with patient.

## 2017-11-16 ENCOUNTER — Telehealth: Payer: Self-pay | Admitting: Endocrinology

## 2017-11-16 NOTE — Telephone Encounter (Signed)
Patient stated that she had ultrasound done yesterday. She seen that there is information on her mychart but doesn't give her any results from this.  Please advise

## 2017-11-16 NOTE — Telephone Encounter (Signed)
Patient just wanted to make sure because she saw that one reading said "moderately suspicious". She wanted to make sure this was ok & this was no change from last time? Please advise?

## 2017-11-16 NOTE — Telephone Encounter (Signed)
Here is mychart note for this: Hi Emily Montgomery:  No change--good. I'll see you next time. I hope you feel well.  If you did not get the report with it, please let me know, and i'll see if I can resent it.

## 2017-11-16 NOTE — Telephone Encounter (Signed)
Given that it has not changed, in my opinion, you can continue to follow it for now.

## 2017-11-19 ENCOUNTER — Telehealth: Payer: Self-pay | Admitting: Endocrinology

## 2017-11-19 NOTE — Telephone Encounter (Signed)
Pt returned Sarah's call. Please call pt at ph# [phone]

## 2017-11-19 NOTE — Telephone Encounter (Signed)
I called LVM with patient that Dr. Loanne Drilling wanted to continue to follow since in his opinion there was no change.

## 2017-11-19 NOTE — Telephone Encounter (Signed)
LVM for patient to call back to let her know they will just continue to follow & check thyroid for now.

## 2018-02-15 DIAGNOSIS — J069 Acute upper respiratory infection, unspecified: Secondary | ICD-10-CM | POA: Diagnosis not present

## 2018-04-01 ENCOUNTER — Ambulatory Visit: Payer: BLUE CROSS/BLUE SHIELD | Admitting: Obstetrics & Gynecology

## 2018-05-21 ENCOUNTER — Encounter

## 2018-06-17 ENCOUNTER — Ambulatory Visit (INDEPENDENT_AMBULATORY_CARE_PROVIDER_SITE_OTHER): Payer: BLUE CROSS/BLUE SHIELD | Admitting: Obstetrics & Gynecology

## 2018-06-17 ENCOUNTER — Other Ambulatory Visit: Payer: Self-pay

## 2018-06-17 ENCOUNTER — Encounter: Payer: Self-pay | Admitting: Obstetrics & Gynecology

## 2018-06-17 VITALS — BP 132/82 | HR 64 | Resp 16 | Ht 62.25 in | Wt 132.4 lb

## 2018-06-17 DIAGNOSIS — Z0000 Encounter for general adult medical examination without abnormal findings: Secondary | ICD-10-CM | POA: Diagnosis not present

## 2018-06-17 DIAGNOSIS — Z01419 Encounter for gynecological examination (general) (routine) without abnormal findings: Secondary | ICD-10-CM | POA: Diagnosis not present

## 2018-06-17 NOTE — Progress Notes (Signed)
58 y.o. [id number] Married White or Caucasian female here for annual exam.  Doing really well.  Very happy with her recovery after surgery.  Completely dry from a urinary standpoint.    Going to stay with her mother for a few weeks as her mother is having a knee replacement.    [name] was 06/23/2015.          Sexually active: Yes.    The current method of family planning is post menopausal status.    Exercising: Yes.    walking Smoker:  no  Health Maintenance: Pap:  09/15/16 Neg. HR HPV:Neg   09/10/14 Neg  History of abnormal Pap:  no MMG:  04/19/17 BIRADS1:Neg  Colonoscopy:  2015 f/u 10 years  BMD:   Never (consider doing at age 44 as ovaries were removed last year) TDaP:  2011 Pneumonia vaccine(s):  n/a Shingrix: D/w pt today  Hep C testing: No Screening Labs: PCP   reports that she has never smoked. She has never used smokeless tobacco. She reports current alcohol use of about 2.0 - 4.0 standard drinks of alcohol per week. She reports that she does not use drugs.  Past Medical History:  Diagnosis Date  . Family history of adverse reaction to anesthesia    sister- ponv  . Multinodular thyroid    goiter--- per pt consulted w/ endocrinologist, told benign cyst  . PMB (postmenopausal bleeding)   . Thickened endometrium    and endometrial irregularity    Past Surgical History:  Procedure Laterality Date  . BLADDER SUSPENSION N/A 02/12/2017   Procedure: TRANSVAGINAL TAPE (TVT) PROCEDURE;  Surgeon: Nunzio Cobbs, MD;  Location: [facility];  Service: Gynecology;  Laterality: N/A;  . COLONOSCOPY WITH PROPOFOL  2015  . CYSTOSCOPY N/A 02/12/2017   Procedure: CYSTOSCOPY;  Surgeon: Nunzio Cobbs, MD;  Location: [facility];  Service: Gynecology;  Laterality: N/A;  . DILATATION & CURETTAGE/HYSTEROSCOPY WITH MYOSURE N/A 09/29/2016   Procedure: DILATATION & CURETTAGE/HYSTEROSCOPY, VULVA BIOPSIES;  Surgeon: Megan Salon, MD;  Location: [facility];  Service: Gynecology;  Laterality: N/A;  . LAPAROSCOPIC BILATERAL SALPINGO OOPHERECTOMY Bilateral 02/12/2017   Procedure: LAPAROSCOPIC BILATERAL SALPINGO OOPHORECTOMY;  Surgeon: Megan Salon, MD;  Location: [facility];  Service: Gynecology;  Laterality: Bilateral;  possible  . LAPAROSCOPIC HYSTERECTOMY N/A 02/12/2017   Procedure: HYSTERECTOMY TOTAL LAPAROSCOPIC;  Surgeon: Megan Salon, MD;  Location: [facility];  Service: Gynecology;  Laterality: N/A;  . PUBOVAGINAL SLING N/A 02/12/2017   Procedure: mid-uretheral SLING;  Surgeon: Nunzio Cobbs, MD;  Location: [facility];  Service: Gynecology;  Laterality: N/A;  . TONSILLECTOMY AND ADENOIDECTOMY  child  . WISDOM TOOTH EXTRACTION  20 approx.    No current outpatient medications on file.   No current facility-administered medications for this visit.     Family History  Problem Relation Age of Onset  . Lung disease Father   . Hypertension Mother   . Cancer Sister        hodgekins  . Thyroid cancer Paternal Grandmother   . Thyroid disease Neg Hx     Review of Systems  All other systems reviewed and are negative.   Exam:   BP (!) 156/100 (BP Location: Left Arm, Patient Position: Sitting, Cuff Size: Large)   Pulse 64   Resp 16   Ht 5' 2.25" (1.581 m)   Wt 132 lb 6.4 oz (60.1 kg)   LMP 06/23/2015  BMI 24.02 kg/m   Height: 5' 2.25" (158.1 cm)  Ht Readings from Last 3 Encounters:  06/17/18 5' 2.25" (1.581 m)  05/04/17 5' 2.5" (1.588 m)  04/06/17 5' 2.5" (1.588 m)   Recheck 132/82  General appearance: alert, cooperative and appears stated age Head: Normocephalic, without obvious abnormality, atraumatic Neck: no adenopathy, supple, symmetrical, trachea midline and thyroid normal to inspection and palpation Lungs: clear to auscultation bilaterally Breasts: normal appearance, no masses or tenderness Heart: regular rate and rhythm Abdomen: soft, non-tender; bowel sounds normal; no masses,  no  organomegaly Extremities: extremities normal, atraumatic, no cyanosis or edema Skin: Skin color, texture, turgor normal. No rashes or lesions Lymph nodes: Cervical, supraclavicular, and axillary nodes normal. No abnormal inguinal nodes palpated Neurologic: Grossly normal   Pelvic: External genitalia:  no lesions              Urethra:  normal appearing urethra with no masses, tenderness or lesions              Bartholins and Skenes: normal                 Vagina: normal appearing vagina with normal color and discharge, no lesions              Cervix: absent              Pap taken: No. Bimanual Exam:  Uterus:  uterus absent              Adnexa: normal adnexa               Rectovaginal: Confirms               Anus:  normal sphincter tone, no lesions  Chaperone was present for exam.  A:  Well Woman with normal exam PMP, no HRT H/o multinodular goiter, followed by Dr. 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  Pt aware and will schedule. pap smear not indicated CBC, CMP, lipids, TSH and Vit D obtained today Shingrix vaccination discussed today Tdap will be due next year Consider BMD in another two years. Pt is going to monitor AM BPs and if has any consistently >130/80, she will call and let me know. return annually or prn

## 2018-06-17 NOTE — Patient Instructions (Signed)
Please your in mornings, once or twice a week.  If you see any values >130/80, please let me know.

## 2018-06-18 LAB — COMPREHENSIVE METABOLIC PANEL
A/G RATIO: 2 (ref 1.2–2.2)
ALK PHOS: 85 IU/L (ref 39–117)
ALT: 17 IU/L (ref 0–32)
AST: 12 IU/L (ref 0–40)
Albumin: 4.5 g/dL (ref 3.8–4.9)
BILIRUBIN TOTAL: 0.3 mg/dL (ref 0.0–1.2)
BUN/Creatinine Ratio: 19 (ref 9–23)
BUN: 12 mg/dL (ref 6–24)
CALCIUM: 9.4 mg/dL (ref 8.7–10.2)
CHLORIDE: 102 mmol/L (ref 96–106)
CO2: 26 mmol/L (ref 20–29)
Creatinine, Ser: 0.62 mg/dL (ref 0.57–1.00)
GFR calc Af Amer: 117 mL/min/{1.73_m2} (ref >59)
GFR, EST NON AFRICAN AMERICAN: 101 mL/min/{1.73_m2} (ref >59)
GLOBULIN, TOTAL: 2.3 g/dL (ref 1.5–4.5)
Glucose: 89 mg/dL (ref 65–99)
POTASSIUM: 4.3 mmol/L (ref 3.5–5.2)
SODIUM: 142 mmol/L (ref 134–144)
Total Protein: 6.8 g/dL (ref 6.0–8.5)

## 2018-06-18 LAB — CBC
HEMATOCRIT: 39.3 % (ref 34.0–46.6)
Hemoglobin: 13.5 g/dL (ref 11.1–15.9)
MCH: 31.8 pg (ref 26.6–33.0)
MCHC: 34.4 g/dL (ref 31.5–35.7)
MCV: 93 fL (ref 79–97)
Platelets: 242 10*3/uL (ref 150–450)
RBC: 4.25 x10E6/uL (ref 3.77–5.28)
RDW: 13.3 % (ref 11.7–15.4)
WBC: 6.1 10*3/uL (ref 3.4–10.8)

## 2018-06-18 LAB — LIPID PANEL
Chol/HDL Ratio: 2.6 ratio (ref 0.0–4.4)
Cholesterol, Total: 149 mg/dL (ref 100–199)
HDL: 57 mg/dL (ref >39)
LDL Calculated: 74 mg/dL (ref 0–99)
TRIGLYCERIDES: 88 mg/dL (ref 0–149)
VLDL Cholesterol Cal: 18 mg/dL (ref 5–40)

## 2018-06-18 LAB — TSH: TSH: 1.11 u[IU]/mL (ref 0.450–4.500)

## 2018-06-18 LAB — VITAMIN D 25 HYDROXY (VIT D DEFICIENCY, FRACTURES): Vit D, 25-Hydroxy: 32.6 ng/mL (ref 30.0–100.0)

## 2018-10-28 ENCOUNTER — Other Ambulatory Visit: Payer: Self-pay

## 2018-10-30 ENCOUNTER — Encounter: Payer: Self-pay | Admitting: Endocrinology

## 2018-10-30 ENCOUNTER — Ambulatory Visit: Payer: BLUE CROSS/BLUE SHIELD | Admitting: Endocrinology

## 2018-10-30 ENCOUNTER — Other Ambulatory Visit: Payer: Self-pay

## 2018-10-30 ENCOUNTER — Ambulatory Visit (INDEPENDENT_AMBULATORY_CARE_PROVIDER_SITE_OTHER): Payer: BLUE CROSS/BLUE SHIELD | Admitting: Endocrinology

## 2018-10-30 VITALS — BP 118/70 | HR 62 | Temp 98.2°F | Wt 132.0 lb

## 2018-10-30 DIAGNOSIS — E042 Nontoxic multinodular goiter: Secondary | ICD-10-CM

## 2018-10-30 NOTE — Patient Instructions (Signed)
We can skip the ultrasound this year.  Please come back for a follow-up appointment in 1 year, when you will be due for another ultrasound

## 2018-10-30 NOTE — Progress Notes (Signed)
Subjective:    Patient ID: Emily Montgomery, female    DOB: 08-29-1960, 58 y.o.   MRN: [id number]  HPI Pt returns for f/u of multinodular goiter, with largest nodule being a cyst (dx'ed early 2016; bx of largest right nodule in 2016 showed cystic fluid--Beth Cat 1; f/u US in 2017 was unchanged; she has been euthyroid off any rx). She does not notice the nodule.   Past Medical History:  Diagnosis Date  . Family history of adverse reaction to anesthesia    sister- ponv  . Multinodular thyroid    goiter--- per pt consulted w/ endocrinologist, told benign cyst  . Thickened endometrium    and endometrial irregularity    Past Surgical History:  Procedure Laterality Date  . BLADDER SUSPENSION N/A 02/12/2017   Procedure: TRANSVAGINAL TAPE (TVT) PROCEDURE;  Surgeon: Nunzio Cobbs, MD;  Location: [facility];  Service: Gynecology;  Laterality: N/A;  . COLONOSCOPY WITH PROPOFOL  2015  . CYSTOSCOPY N/A 02/12/2017   Procedure: CYSTOSCOPY;  Surgeon: Nunzio Cobbs, MD;  Location: [facility];  Service: Gynecology;  Laterality: N/A;  . DILATATION & CURETTAGE/HYSTEROSCOPY WITH MYOSURE N/A 09/29/2016   Procedure: DILATATION & CURETTAGE/HYSTEROSCOPY, VULVA BIOPSIES;  Surgeon: Megan Salon, MD;  Location: [facility];  Service: Gynecology;  Laterality: N/A;  . LAPAROSCOPIC BILATERAL SALPINGO OOPHERECTOMY Bilateral 02/12/2017   Procedure: LAPAROSCOPIC BILATERAL SALPINGO OOPHORECTOMY;  Surgeon: Megan Salon, MD;  Location: [facility];  Service: Gynecology;  Laterality: Bilateral;  possible  . LAPAROSCOPIC HYSTERECTOMY N/A 02/12/2017   Procedure: HYSTERECTOMY TOTAL LAPAROSCOPIC;  Surgeon: Megan Salon, MD;  Location: [facility];  Service: Gynecology;  Laterality: N/A;  . PUBOVAGINAL SLING N/A 02/12/2017   Procedure: mid-uretheral SLING;  Surgeon: Nunzio Cobbs, MD;  Location: [facility];  Service: Gynecology;  Laterality: N/A;  . TONSILLECTOMY AND ADENOIDECTOMY  child  .  WISDOM TOOTH EXTRACTION  20 approx.    Social History   Socioeconomic History  . Marital status: Married    Spouse name: Not on file  . Number of children: Not on file  . Years of education: Not on file  . Highest education level: Not on file  Occupational History  . Not on file  Social Needs  . Financial resource strain: Not on file  . Food insecurity:    Worry: Not on file    Inability: Not on file  . Transportation needs:    Medical: Not on file    Non-medical: Not on file  Tobacco Use  . Smoking status: Never Smoker  . Smokeless tobacco: Never Used  Substance and Sexual Activity  . Alcohol use: Yes    Alcohol/week: 2.0 - 4.0 standard drinks    Types: 2 - 4 Standard drinks or equivalent per week    Comment: occasional  . Drug use: No  . Sexual activity: Yes    Partners: Male    Birth control/protection: Post-menopausal, Surgical    Comment: Hysterectomy  Lifestyle  . Physical activity:    Days per week: 0 days    Minutes per session: 0 min  . Stress: Not at all  Relationships  . Social connections:    Talks on phone: Not on file    Gets together: Not on file    Attends religious service: Not on file    Active member of club or organization: Not on file    Attends meetings of clubs or organizations: Not on file  Relationship status: Not on file  . Intimate partner violence:    Fear of current or ex partner: Not on file    Emotionally abused: Not on file    Physically abused: Not on file    Forced sexual activity: Not on file  Other Topics Concern  . Not on file  Social History Narrative  . Not on file    Current Outpatient Medications on File Prior to Visit  Medication Sig Dispense Refill  . Omega-3 Fatty Acids (FISH OIL) 1000 MG CAPS Take 1 capsule by mouth daily.     No current facility-administered medications on file prior to visit.     No Known Allergies  Family History  Problem Relation Age of Onset  . Lung disease Father   .  Hypertension Mother   . Cancer Sister        hodgekins  . Thyroid cancer Paternal Grandmother   . Thyroid disease Neg Hx     BP 118/70 (BP Location: Left Arm, Patient Position: Sitting, Cuff Size: Normal)   Pulse 62   Temp 98.2 F (36.8 C) (Oral)   Wt 132 lb (59.9 kg)   LMP 06/23/2015   SpO2 98%   BMI 23.95 kg/m    Review of Systems Denies pain.      Objective:   Physical Exam VITAL SIGNS:  See vs page.   GENERAL: no distress.   NECK: 2 cm longitudinal right thyroid nodule is again palpable.         Assessment & Plan:  MNG: clinically stable.   Patient Instructions  We can skip the ultrasound this year.  Please come back for a follow-up appointment in 1 year, when you will be due for another ultrasound

## 2019-05-30 ENCOUNTER — Ambulatory Visit: Payer: BLUE CROSS/BLUE SHIELD | Admitting: Obstetrics & Gynecology

## 2019-08-11 ENCOUNTER — Encounter: Payer: Self-pay | Admitting: Certified Nurse Midwife

## 2019-08-20 DIAGNOSIS — H40013 Open angle with borderline findings, low risk, bilateral: Secondary | ICD-10-CM | POA: Diagnosis not present

## 2019-08-25 DIAGNOSIS — Z1231 Encounter for screening mammogram for malignant neoplasm of breast: Secondary | ICD-10-CM | POA: Diagnosis not present

## 2019-08-25 DIAGNOSIS — Z1239 Encounter for other screening for malignant neoplasm of breast: Secondary | ICD-10-CM | POA: Diagnosis not present

## 2019-10-10 ENCOUNTER — Telehealth: Payer: Self-pay | Admitting: Endocrinology

## 2019-10-10 DIAGNOSIS — E042 Nontoxic multinodular goiter: Secondary | ICD-10-CM

## 2019-10-10 NOTE — Telephone Encounter (Signed)
Either is OK with me

## 2019-10-10 NOTE — Telephone Encounter (Signed)
Called pt and made her aware. Verbalized acceptance and understanding. Pt provided with contact # and advised to call and schedule appt.  Please place orders for [place]

## 2019-10-10 NOTE — Telephone Encounter (Signed)
done

## 2019-10-10 NOTE — Telephone Encounter (Signed)
Please advise about your preference

## 2019-10-10 NOTE — Telephone Encounter (Signed)
US THYROID (Order [id number]) Imaging Date: 10/10/2019 Department: [facility] Ordering/Authorizing: Renato Shin, MD  Order Information  Order Date/Time Release Date/Time Start Date/Time End Date/Time  10/10/19 10:23 AM None 10/10/2019 None   As documented below, pt provided with contact # for self scheduling purposes.

## 2019-10-10 NOTE — Telephone Encounter (Signed)
Patient called asking if she needed to get her ultrasound done before her appointment here on 10/21/19 or if it can be after. Ph# [phone]

## 2019-10-21 ENCOUNTER — Ambulatory Visit: Payer: BLUE CROSS/BLUE SHIELD | Admitting: Endocrinology

## 2019-10-24 ENCOUNTER — Ambulatory Visit
Admission: RE | Admit: 2019-10-24 | Discharge: 2019-10-24 | Disposition: A | Discharge status: Home / Self Care | Payer: BC Managed Care – PPO | Source: Ambulatory Visit | Attending: Endocrinology | Admitting: Endocrinology

## 2019-10-24 DIAGNOSIS — E042 Nontoxic multinodular goiter: Secondary | ICD-10-CM

## 2019-10-30 ENCOUNTER — Ambulatory Visit: Payer: BLUE CROSS/BLUE SHIELD | Admitting: Obstetrics & Gynecology

## 2019-10-31 ENCOUNTER — Ambulatory Visit: Payer: BLUE CROSS/BLUE SHIELD | Admitting: Endocrinology

## 2019-11-13 ENCOUNTER — Ambulatory Visit (INDEPENDENT_AMBULATORY_CARE_PROVIDER_SITE_OTHER): Payer: BC Managed Care – PPO | Admitting: Endocrinology

## 2019-11-13 ENCOUNTER — Encounter: Payer: Self-pay | Admitting: Endocrinology

## 2019-11-13 ENCOUNTER — Other Ambulatory Visit: Payer: Self-pay

## 2019-11-13 VITALS — BP 130/88 | HR 68 | Ht 62.25 in | Wt 133.0 lb

## 2019-11-13 DIAGNOSIS — E042 Nontoxic multinodular goiter: Secondary | ICD-10-CM | POA: Diagnosis not present

## 2019-11-13 LAB — T4, FREE: Free T4: 0.95 ng/dL (ref 0.60–1.60)

## 2019-11-13 LAB — TSH: TSH: 0.65 u[IU]/mL (ref 0.35–4.50)

## 2019-11-13 NOTE — Patient Instructions (Addendum)
Blood tests are requested for you today.  We'll let you know about the results.  I would be happy to see you back here as needed.

## 2019-11-13 NOTE — Progress Notes (Signed)
Subjective:    Patient ID: Emily Montgomery, female    DOB: [birth year], 59 y.o.   MRN: [id number]  HPI Pt returns for f/u of multinodular goiter, with largest nodule being a cyst (dx'ed early 2016; bx of largest right nodule in 2016 showed cystic fluid--Beth Cat 1; f/u US in 2019 was unchanged; she has been euthyroid off any rx). She does not notice the nodule.   Past Medical History:  Diagnosis Date  . Family history of adverse reaction to anesthesia    sister- ponv  . Multinodular thyroid    goiter--- per pt consulted w/ endocrinologist, told benign cyst  . Thickened endometrium    and endometrial irregularity    Past Surgical History:  Procedure Laterality Date  . BLADDER SUSPENSION N/A 02/12/2017   Procedure: TRANSVAGINAL TAPE (TVT) PROCEDURE;  Surgeon: Nunzio Cobbs, MD;  Location: [facility];  Service: Gynecology;  Laterality: N/A;  . COLONOSCOPY WITH PROPOFOL  2015  . CYSTOSCOPY N/A 02/12/2017   Procedure: CYSTOSCOPY;  Surgeon: Nunzio Cobbs, MD;  Location: [facility];  Service: Gynecology;  Laterality: N/A;  . DILATATION & CURETTAGE/HYSTEROSCOPY WITH MYOSURE N/A 09/29/2016   Procedure: DILATATION & CURETTAGE/HYSTEROSCOPY, VULVA BIOPSIES;  Surgeon: Megan Salon, MD;  Location: [facility];  Service: Gynecology;  Laterality: N/A;  . LAPAROSCOPIC BILATERAL SALPINGO OOPHERECTOMY Bilateral 02/12/2017   Procedure: LAPAROSCOPIC BILATERAL SALPINGO OOPHORECTOMY;  Surgeon: Megan Salon, MD;  Location: [facility];  Service: Gynecology;  Laterality: Bilateral;  possible  . LAPAROSCOPIC HYSTERECTOMY N/A 02/12/2017   Procedure: HYSTERECTOMY TOTAL LAPAROSCOPIC;  Surgeon: Megan Salon, MD;  Location: [facility];  Service: Gynecology;  Laterality: N/A;  . PUBOVAGINAL SLING N/A 02/12/2017   Procedure: mid-uretheral SLING;  Surgeon: Nunzio Cobbs, MD;  Location: [facility];  Service: Gynecology;  Laterality: N/A;  . TONSILLECTOMY AND ADENOIDECTOMY  child  .  WISDOM TOOTH EXTRACTION  20 approx.    Social History   Socioeconomic History  . Marital status: Married    Spouse name: Not on file  . Number of children: Not on file  . Years of education: Not on file  . Highest education level: Not on file  Occupational History  . Not on file  Tobacco Use  . Smoking status: Never Smoker  . Smokeless tobacco: Never Used  Vaping Use  . Vaping Use: Never used  Substance and Sexual Activity  . Alcohol use: Yes    Alcohol/week: 2.0 - 4.0 standard drinks    Types: 2 - 4 Standard drinks or equivalent per week    Comment: occasional  . Drug use: No  . Sexual activity: Yes    Partners: Male    Birth control/protection: Post-menopausal, Surgical    Comment: Hysterectomy  Other Topics Concern  . Not on file  Social History Narrative  . Not on file   Social Determinants of Health   Financial Resource Strain:   . Difficulty of Paying Living Expenses:   Food Insecurity:   . Worried About Charity fundraiser in the Last Year:   . Arboriculturist in the Last Year:   Transportation Needs:   . Film/video editor (Medical):   Marland Kitchen Lack of Transportation (Non-Medical):   Physical Activity:   . Days of Exercise per Week:   . Minutes of Exercise per Session:   Stress:   . Feeling of Stress :   Social Connections:   . Frequency of Communication with  Friends and Family:   . Frequency of Social Gatherings with Friends and Family:   . Attends Religious Services:   . Active Member of Clubs or Organizations:   . Attends Archivist Meetings:   Marland Kitchen Marital Status:   Intimate Partner Violence:   . Fear of Current or Ex-Partner:   . Emotionally Abused:   Marland Kitchen Physically Abused:   . Sexually Abused:     Current Outpatient Medications on File Prior to Visit  Medication Sig Dispense Refill  . Omega-3 Fatty Acids (FISH OIL) 1000 MG CAPS Take 1 capsule by mouth daily.     No current facility-administered medications on file prior to visit.     No Known Allergies  Family History  Problem Relation Age of Onset  . Lung disease Father   . Hypertension Mother   . Cancer Sister        hodgekins  . Thyroid cancer Paternal Grandmother   . Thyroid disease Neg Hx     BP 130/88   Pulse 68   Ht 5' 2.25" (1.581 m)   Wt 133 lb (60.3 kg)   LMP 06/23/2015   SpO2 98%   BMI 24.13 kg/m    Review of Systems Denies neck pain    Objective:   Physical Exam VITAL SIGNS:  See vs page.   GENERAL: no distress.   NECK: 2 cm longitudinal right thyroid nodule is again palpable.    Lab Results  Component Value Date   TSH 0.65 11/13/2019   T4TOTAL 9.4 09/10/2014     (I reviewed [place] results with patient).      Assessment & Plan:  Small MNG, clinically stable Low-normal TSH. She is at risk for hyperthyroidism.  She should have annual TSH and PE of the thyroid  Patient Instructions  Blood tests are requested for you today.  We'll let you know about the results.  I would be happy to see you back here as needed.

## 2019-12-23 ENCOUNTER — Encounter: Payer: Self-pay | Admitting: Obstetrics & Gynecology

## 2020-02-05 IMAGING — US US THYROID
1 series · 12 of 25 positions shown · non-contrast
Comparison: 09/27/2015

CLINICAL DATA: Goiter.  Multinodular goiter.

EXAM:
THYROID ULTRASOUND
TECHNIQUE: Ultrasound examination of the thyroid gland and adjacent soft
tissues was performed.

[Series 1: us thyroid · 0.04mm/px · 12 of 54 slices shown]
[im 3/54]
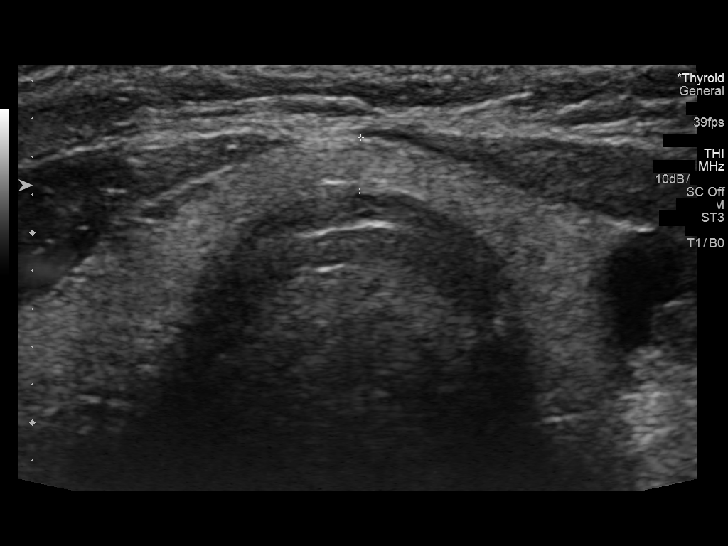
[im 7/54]
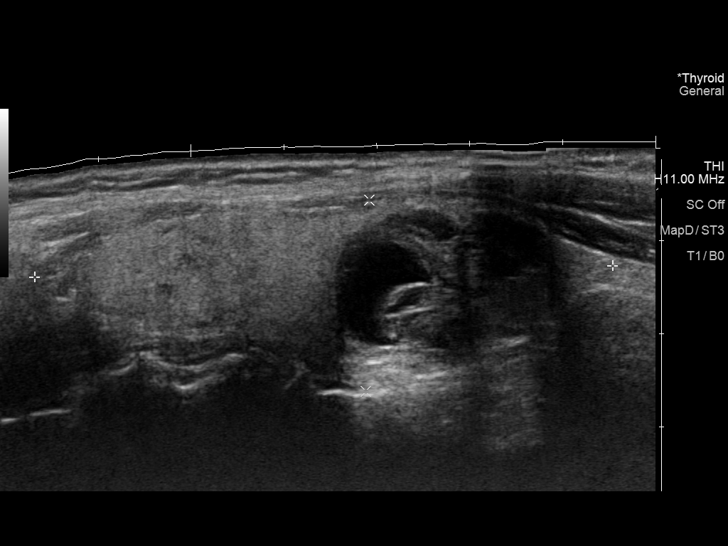
[im 12/54]
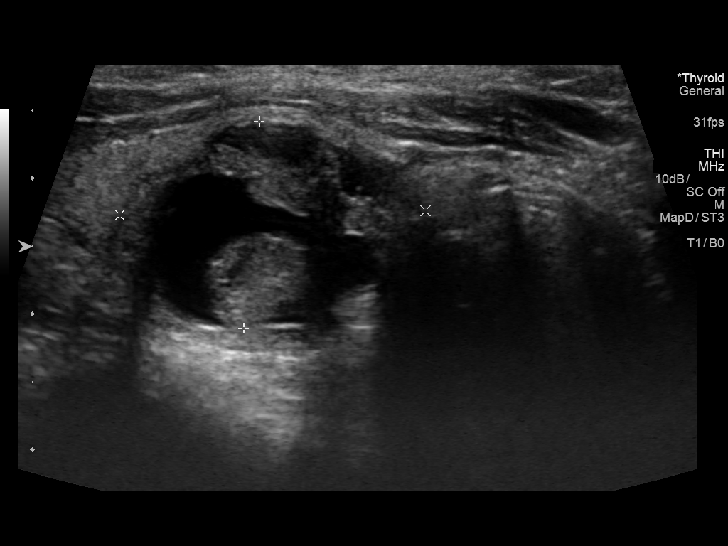
[im 16/54]
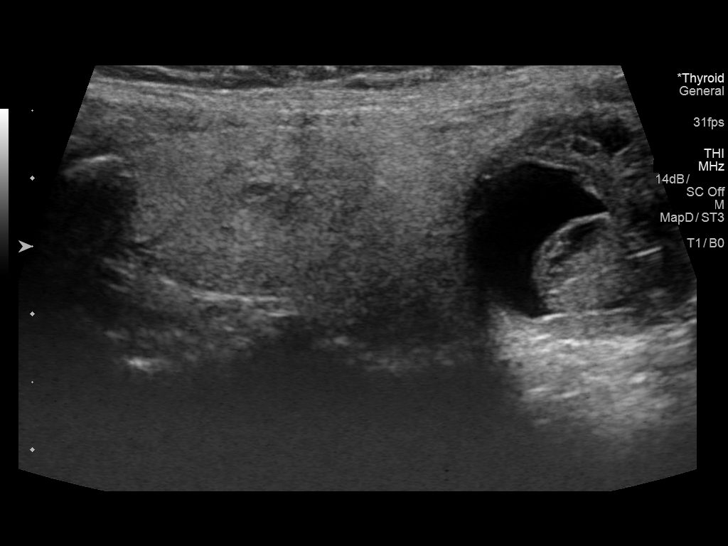
[im 20/54]
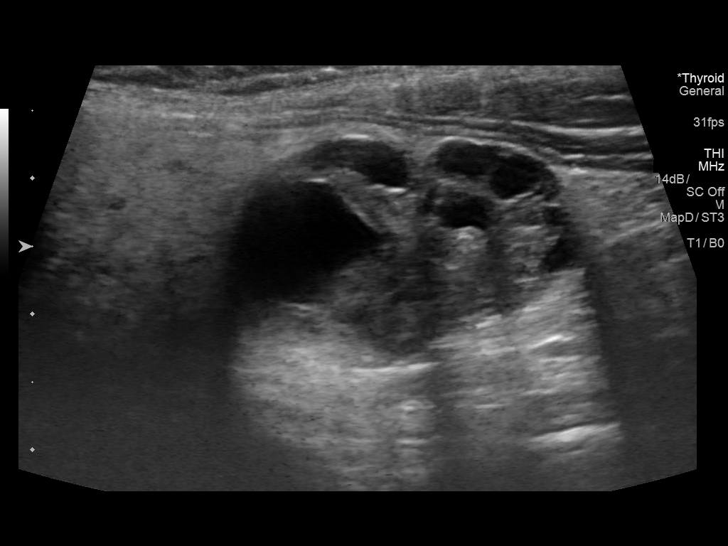
[im 25/54]
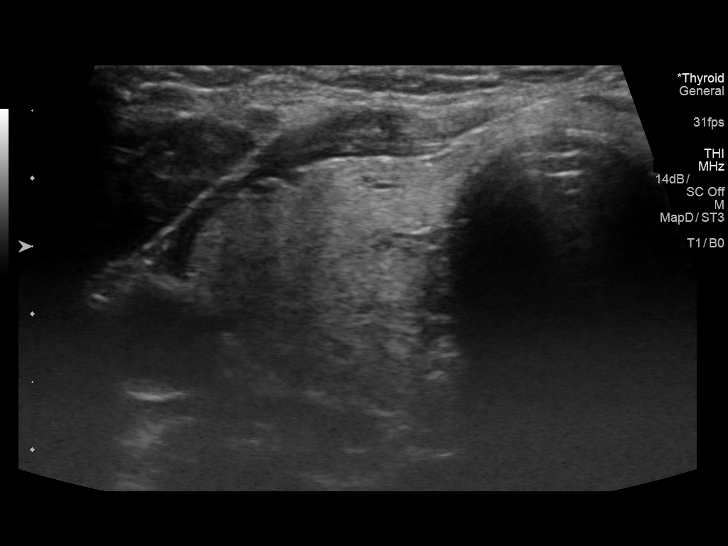
[im 29/54]
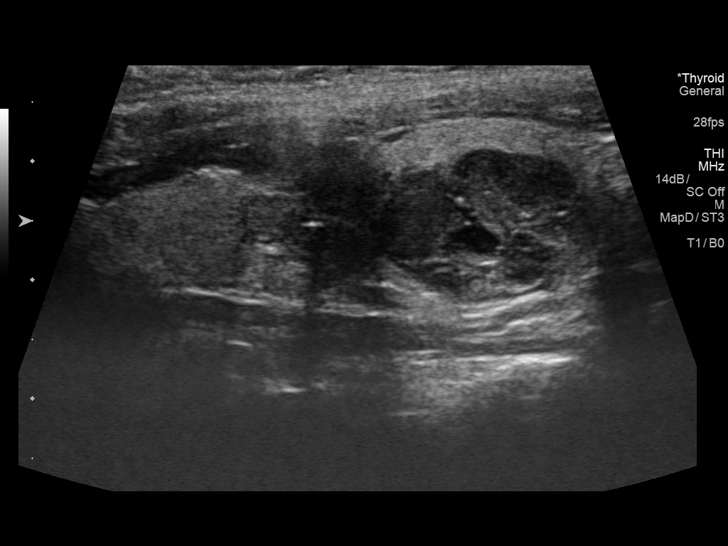
[im 34/54]
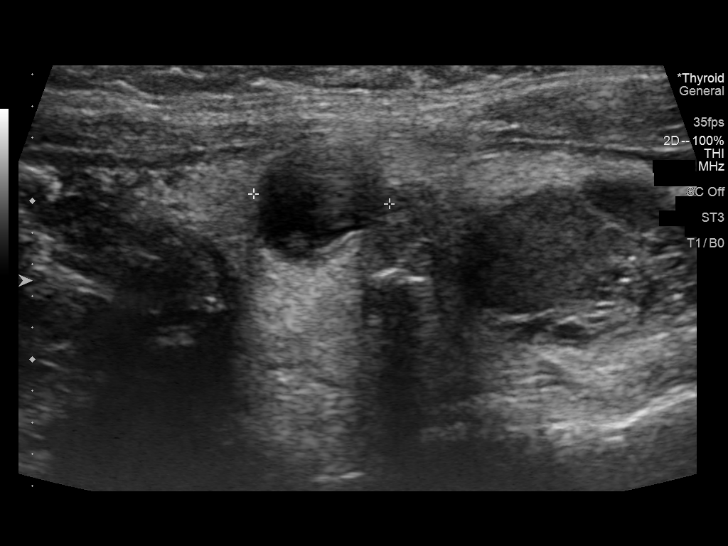
[im 38/54]
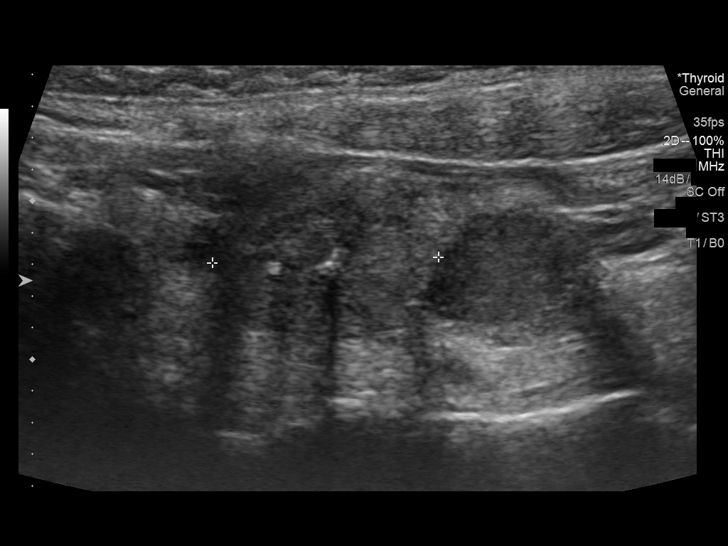
[im 42/54]
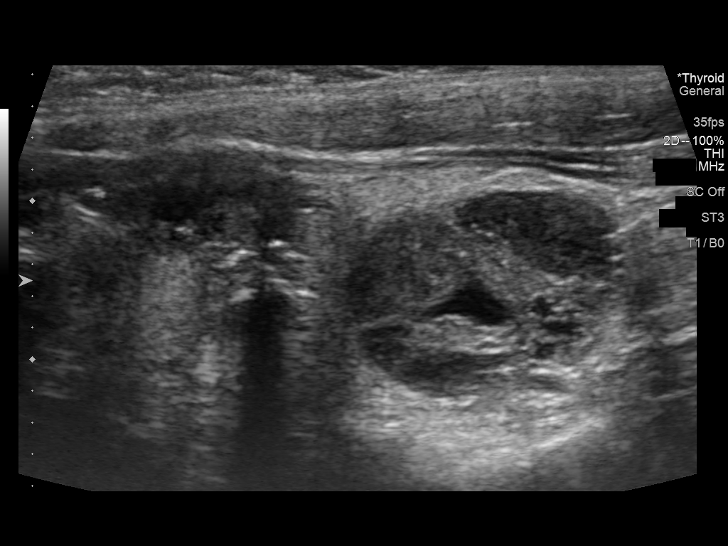
[im 47/54]
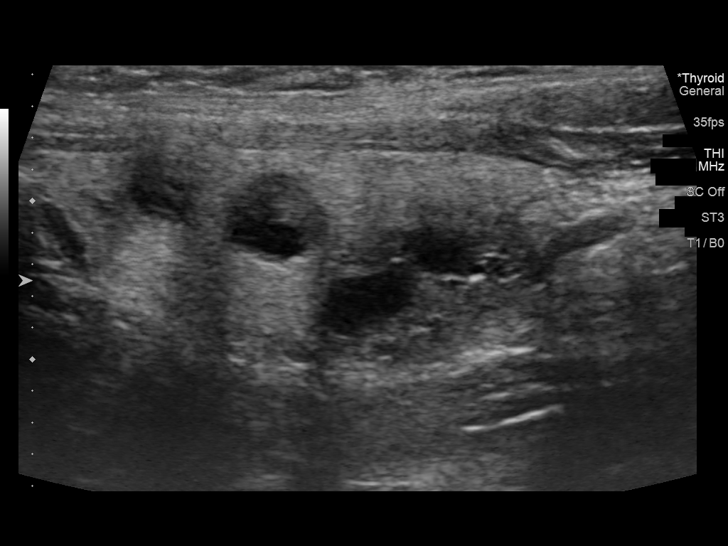
[im 51/54]
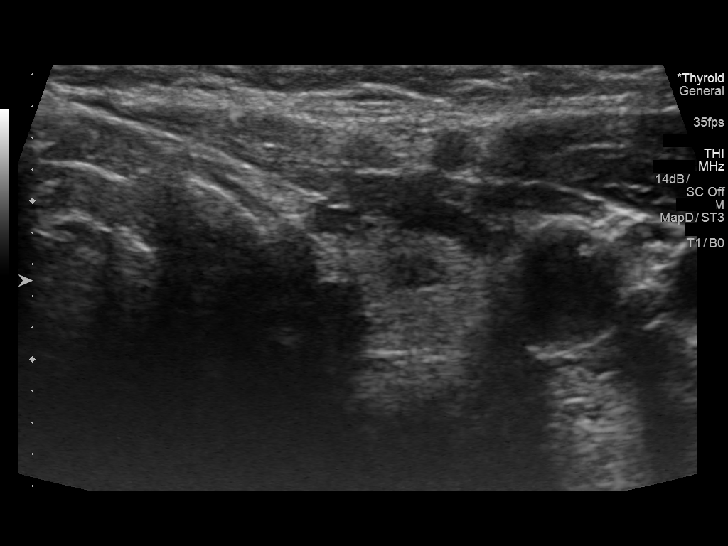

[12 of 25 positions shown; findings below may reference images not displayed]

FINDING: FINDING
Parenchymal Echotexture: Mildly heterogenous

Isthmus: 0.3 cm, previously 0.2 cm

Right lobe: 6.2 x 2.1 x 1.9 cm, previously 5.7 x 2.0 x 2.2 cm

Left lobe: 4.4 x 1.6 x 1.8 cm, previously 4.5 x 1.4 x 2.0 cm

_________________________________________________________

Estimated total number of nodules >/= 1 cm: 3

Number of spongiform nodules >/=  2 cm not described below (TR1): 0

Number of mixed cystic and solid nodules >/= 1.5 cm not described
below (TR2): 0

_________________________________________________________

Nodule # 1:

Prior biopsy: No

Location: Right; Inferior

Maximum size: 2.8 cm; Other 2 dimensions: 2.3 x 1.5 cm, previously,
3.3 x 1.9 x 1.7 cm

Composition: mixed cystic and solid (1)

Echogenicity: isoechoic (1)

Shape: not taller-than-wide (0)

Margins: smooth (0)

Echogenic foci: none (0)

ACR TI-RADS total points: 2.

ACR TI-RADS risk category:  TR2 (2 points).

Significant change in size (>/= 20% in two dimensions and minimal
increase of 2 mm): No

Change in features: No

Change in ACR TI-RADS risk category: No

ACR TI-RADS recommendations:

This nodule does NOT meet TI-RADS criteria for biopsy or dedicated
follow-up.

_________________________________________________________

Nodule # 4:

Prior biopsy: No

Location: Left; Mid

Maximum size: 1.4 cm; Other 2 dimensions: 1.3 x 1.1 cm, previously,
1.4 x 1.1 x 1.1 cm

Composition: solid/almost completely solid (2)

Echogenicity: hypoechoic (2)

Shape: not taller-than-wide (0)

Margins: smooth (0)

Echogenic foci: macrocalcifications (1)

ACR TI-RADS total points: 5.

ACR TI-RADS risk category:  TR4 (4-6 points).

Significant change in size (>/= 20% in two dimensions and minimal
increase of 2 mm): No

Change in features: No

Change in ACR TI-RADS risk category: No

ACR TI-RADS recommendations:

*Given size (>/= 1 - 1.4 cm) and appearance, a follow-up ultrasound
in 1 year should be considered based on TI-RADS criteria.

_________________________________________________________

Nodule # 5:

Prior biopsy: No

Location: Left; Inferior

Maximum size: 1.6 cm; Other 2 dimensions: 1.6 x 1.2 cm, previously,
1.6 x 1.6 x 1.2 cm

Composition: mixed cystic and solid (1)

Echogenicity: hypoechoic (2)

Shape: not taller-than-wide (0)

Margins: smooth (0)

Echogenic foci: punctate echogenic foci (3)

ACR TI-RADS total points: 6.

ACR TI-RADS risk category:  TR4 (4-6 points).

Significant change in size (>/= 20% in two dimensions and minimal
increase of 2 mm): No

Change in features: No

Change in ACR TI-RADS risk category: No

ACR TI-RADS recommendations:

**Given size (>/= 1.5 cm) and appearance, fine needle aspiration of
this moderately suspicious nodule should be considered based on
TI-RADS criteria.

_________________________________________________________
IMPRESSION: Nodule 1 is stable and does not meet criteria for follow-up nor
biopsy.

Nodule 4 is stable and meets criteria for annual follow-up.

Nodule 5 is stable and continues to meet criteria for fine needle
aspiration biopsy.

The above is in keeping with the ACR TI-RADS recommendations - [HOSPITAL] 8910;[DATE].

## 2020-02-10 DIAGNOSIS — U071 COVID-19: Secondary | ICD-10-CM | POA: Diagnosis not present

## 2020-02-10 DIAGNOSIS — Z0000 Encounter for general adult medical examination without abnormal findings: Secondary | ICD-10-CM | POA: Diagnosis not present

## 2020-02-10 DIAGNOSIS — R7309 Other abnormal glucose: Secondary | ICD-10-CM | POA: Diagnosis not present

## 2020-02-10 DIAGNOSIS — Z13220 Encounter for screening for lipoid disorders: Secondary | ICD-10-CM | POA: Diagnosis not present

## 2020-04-06 DIAGNOSIS — Z20822 Contact with and (suspected) exposure to covid-19: Secondary | ICD-10-CM | POA: Diagnosis not present

## 2020-04-06 DIAGNOSIS — Z03818 Encounter for observation for suspected exposure to other biological agents ruled out: Secondary | ICD-10-CM | POA: Diagnosis not present

## 2020-10-12 IMAGING — US US THYROID
1 series · 13 of 25 positions shown · non-contrast
Comparison: 12/15/2017 and previous back to 09/15/2014

CLINICAL DATA: Nodules

EXAM:
THYROID ULTRASOUND
TECHNIQUE: Ultrasound examination of the thyroid gland and adjacent soft
tissues was performed.

[Series 1: us thyroid · 0.05mm/px · 13 of 49 slices shown]
[im 1/49]
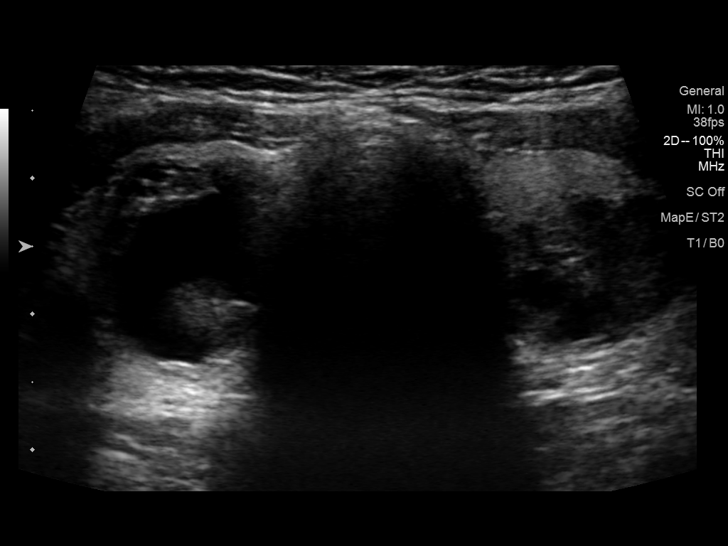
[im 5/49]
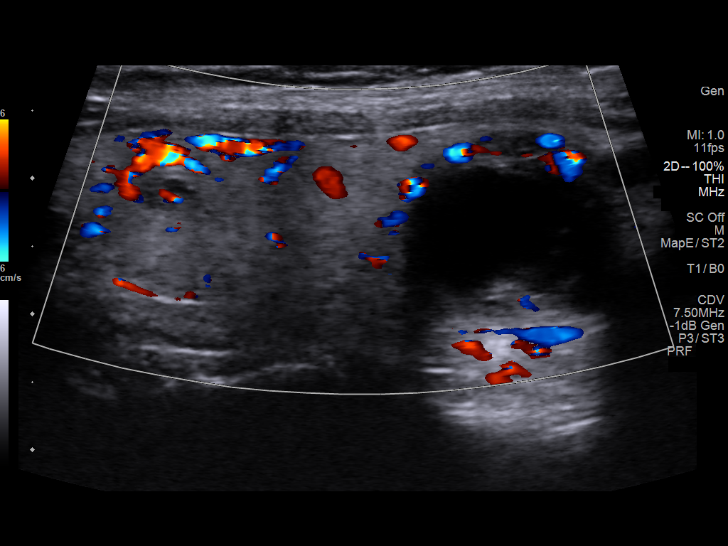
[im 9/49]
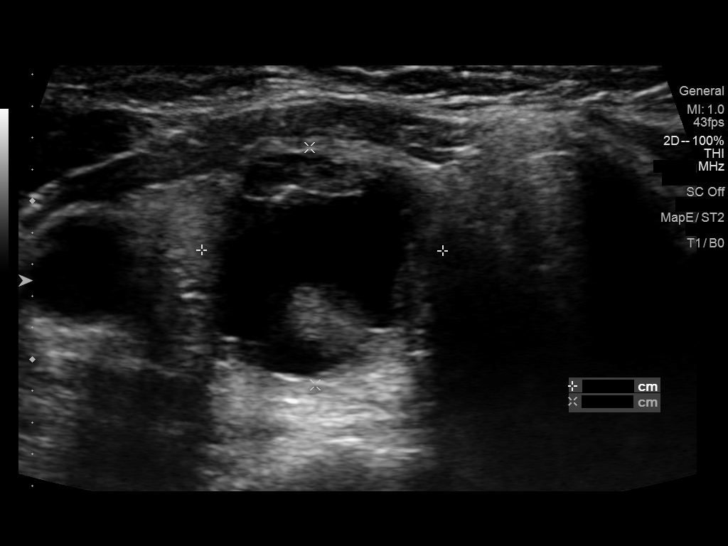
[im 13/49]
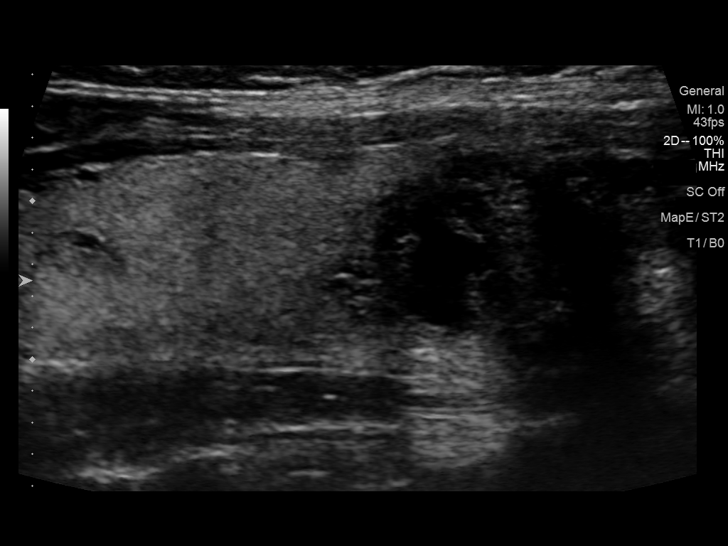
[im 17/49]
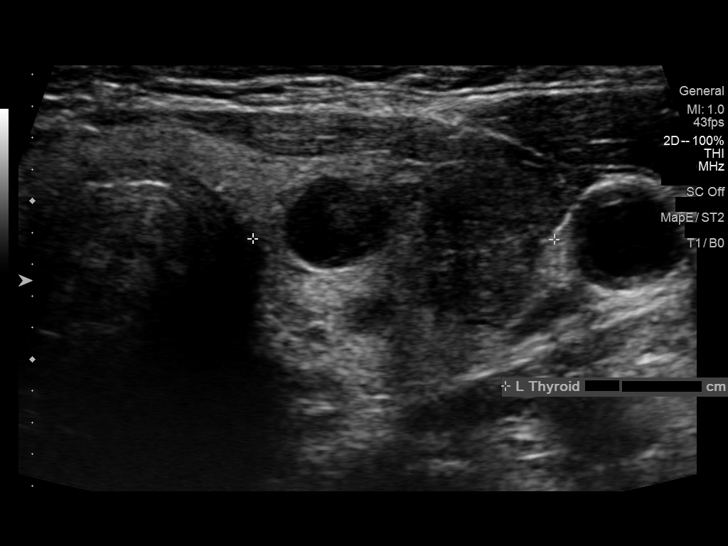
[im 21/49]
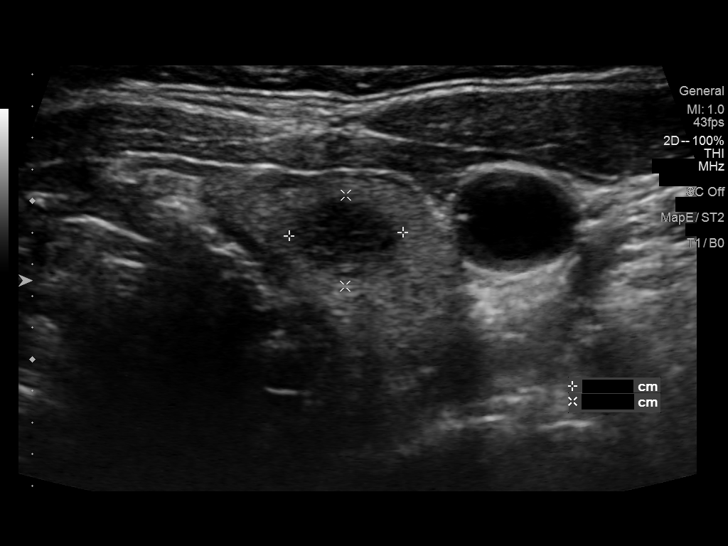
[im 25/49]
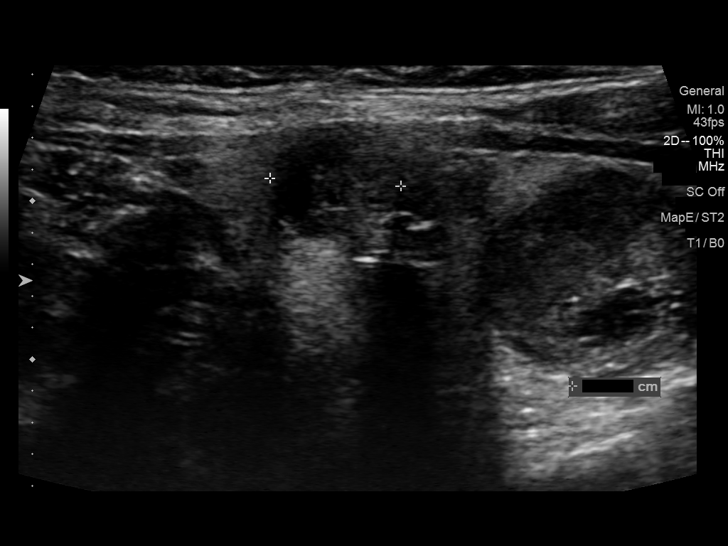
[im 29/49]
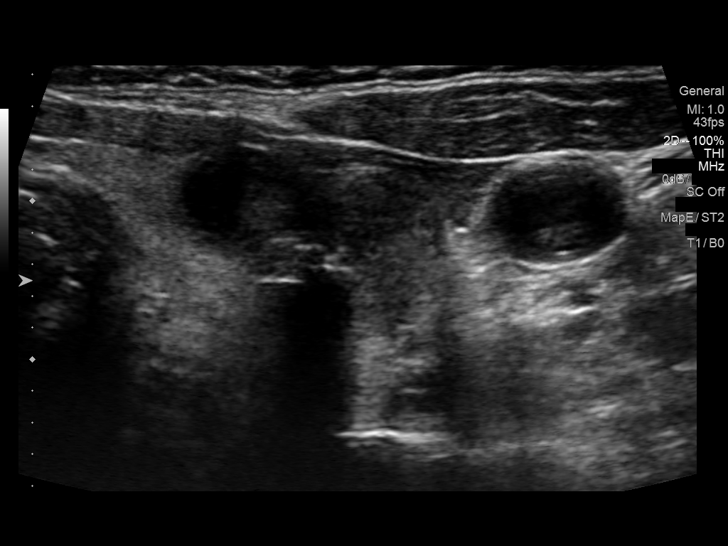
[im 33/49]
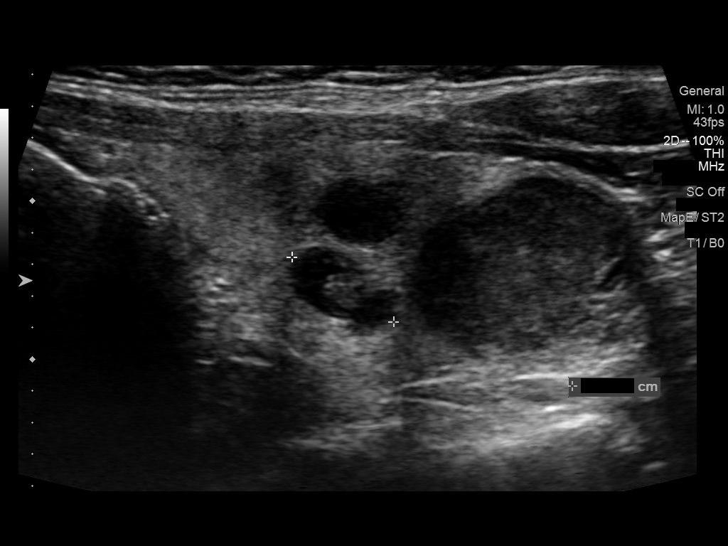
[im 37/49]
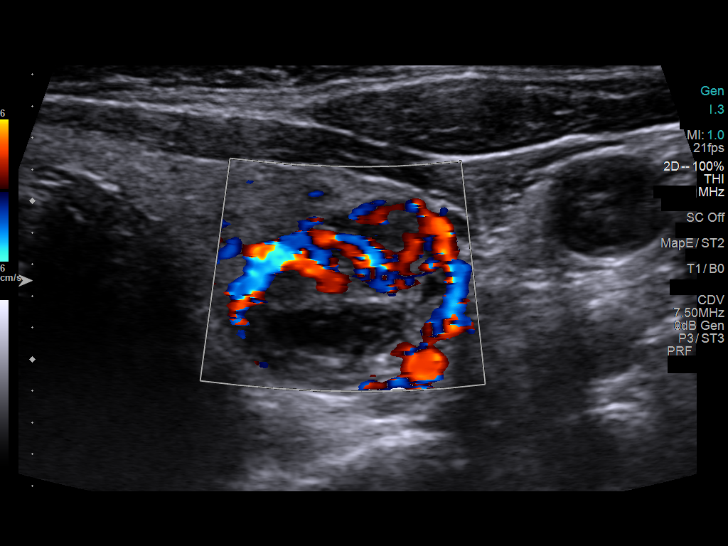
[im 41/49]
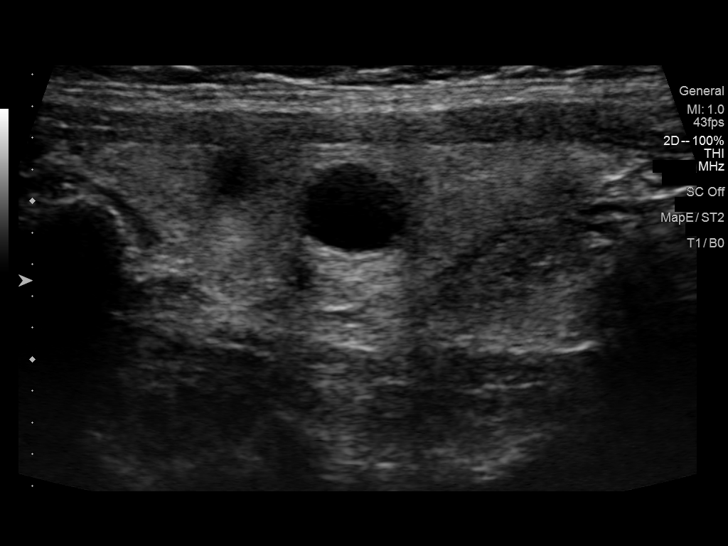
[im 45/49]
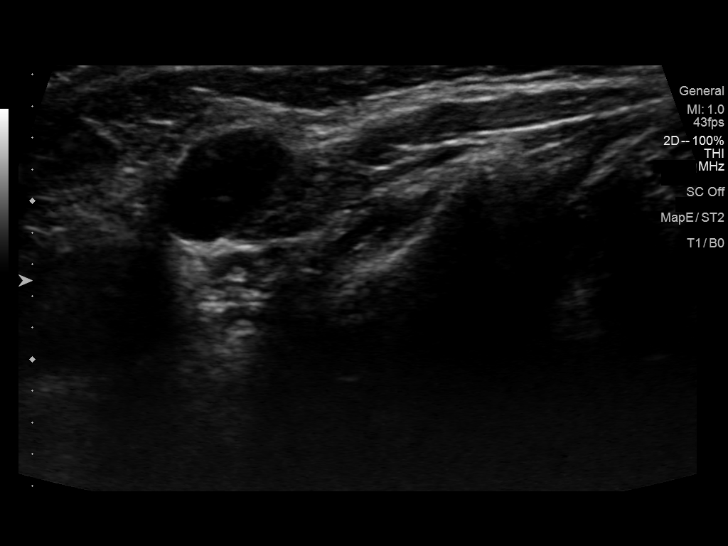
[im 49/49]
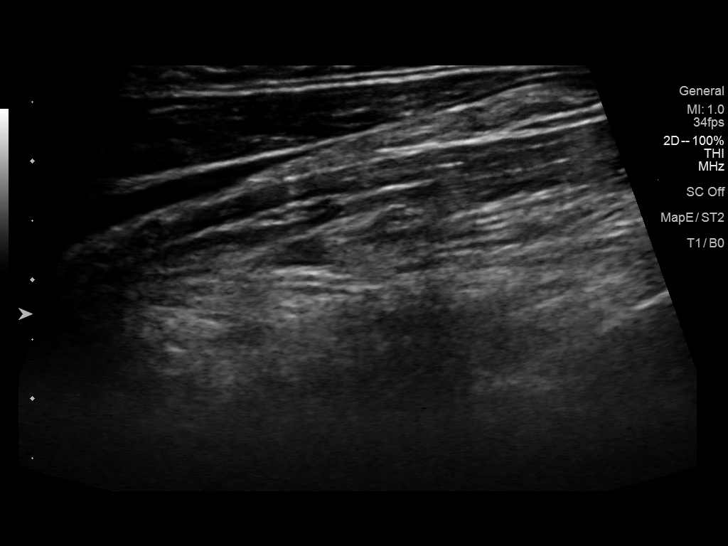

[13 of 25 positions shown; findings below may reference images not displayed]

FINDINGS: Parenchymal Echotexture: Mildly heterogenous

Isthmus: 0.3 cm thickness, stable

Right lobe: 5.7 x 1.5 x 2.2 cm, previously 6.2 x 2.1 x

Left lobe: 4.1 x 1.6 x 1.9 cm, previously 4.4 x 1.6 x

_________________________________________________________

Estimated total number of nodules >/= 1 cm: 3

Number of spongiform nodules >/=  2 cm not described below (TR1): 0

Number of mixed cystic and solid nodules >/= 1.5 cm not described
below (TR2): 0

_________________________________________________________

Nodule # 1: 2.8 x 1.5 x 1.5 cm complex cystic/solid, inferior right,
previously 3.7 x 2.6 x 1.9 on 09/15/2014; stability for greater than
5 years implies benignity; This nodule does NOT meet TI-RADS
criteria for biopsy or dedicated follow-up.

Nodule # 2: 0.7 cm hypoechoic nodule without calcifications,
superior left, previously 0.9; This nodule does NOT meet TI-RADS
criteria for biopsy or dedicated follow-up.

Nodule # 3: 0.9 cm hypoechoic nodule without calcifications, mid
left, previously 0.6; This nodule does NOT meet TI-RADS criteria for
biopsy or dedicated follow-up.

Nodule # 4:

Prior biopsy: No

Location: Left; Mid lateral

Maximum size: 1.1 cm; Other 2 dimensions: 1.1 x 1.1 cm, previously,
1.4 x 1.3 x 1.1 cm

Composition: solid/almost completely solid (2)

Echogenicity: hypoechoic (2)

Shape: not taller-than-wide (0)

Margins: ill-defined (0)

Echogenic foci: macrocalcifications (1)

ACR TI-RADS total points: 5.

ACR TI-RADS risk category:  TR4 (4-6 points).

Significant change in size (>/= 20% in two dimensions and minimal
increase of 2 mm): No

Change in features: No

Change in ACR TI-RADS risk category: No

ACR TI-RADS recommendations:

*Given size (>/= 1 - 1.4 cm) and appearance, a follow-up ultrasound
in 1 year should be considered based on TI-RADS criteria.

_________________________________________________________

Nodule # 5: 0.7 cm complex cyst, inferior medial left; This nodule
does NOT meet TI-RADS criteria for biopsy or dedicated follow-up.

Nodule # 6: 1.5 x 1.4 x 1.2 cm inferior left, previously 1.4 x 1.4 x
0.9 on 09/15/2014; stability for greater than 5 years implies
benignity; This nodule does NOT meet TI-RADS criteria for biopsy or
dedicated follow-up.
IMPRESSION: 1. Borderline thyromegaly with bilateral nodules. None meets
criteria for biopsy.
2. Recommend annual/biennial ultrasound follow-up of mid left nodule
as above, until stability x5 years confirmed.

The above is in keeping with the ACR TI-RADS recommendations - [HOSPITAL] 1712;[DATE].

## 2022-09-14 DIAGNOSIS — H04123 Dry eye syndrome of bilateral lacrimal glands: Secondary | ICD-10-CM | POA: Diagnosis not present

## 2022-11-15 ENCOUNTER — Encounter: Payer: Self-pay | Admitting: Family Medicine

## 2022-11-15 ENCOUNTER — Ambulatory Visit (INDEPENDENT_AMBULATORY_CARE_PROVIDER_SITE_OTHER): Payer: BC Managed Care – PPO | Admitting: Family Medicine

## 2022-11-15 VITALS — BP 130/82 | HR 70 | Temp 98.0°F | Ht 62.5 in | Wt 137.0 lb

## 2022-11-15 DIAGNOSIS — E042 Nontoxic multinodular goiter: Secondary | ICD-10-CM

## 2022-11-15 DIAGNOSIS — Z23 Encounter for immunization: Secondary | ICD-10-CM

## 2022-11-15 DIAGNOSIS — Z13220 Encounter for screening for lipoid disorders: Secondary | ICD-10-CM

## 2022-11-15 DIAGNOSIS — Z1231 Encounter for screening mammogram for malignant neoplasm of breast: Secondary | ICD-10-CM

## 2022-11-15 DIAGNOSIS — Z1159 Encounter for screening for other viral diseases: Secondary | ICD-10-CM

## 2022-11-15 NOTE — Assessment & Plan Note (Signed)
Patient has had documented stability of thyroid nodules for 5 years. We discussed that there was not an indication for further evaluation without signs or symptoms of some change in the status of the thyroid.

## 2022-11-15 NOTE — Progress Notes (Signed)
[facility] [address] [place] [address] Dept: [phone] Dept Fax: [phone]  New Patient Office Visit  Subjective:    Patient ID: Emily Montgomery, female    DOB: [birth year], 62 y.o..   MRN: [id number]  Chief Complaint  Patient presents with   Establish Care    NP- establish care.  No concerns.  Not fasting today.     History of Present Illness:  Patient is in today to establish care. Emily Montgomery was born in [place], [place]. Her family moved to [place], [place] when she was a young child. She attended [employer], majoring in computers, where she met her husband Clide Cliff). They have been married for 38 years. They have two children: a son (16) and a daughter Vernona Rieger- 52). She has five grandchildren. Emily Montgomery denies use of tobacco ro drugs. She drinks occasionally.  Emily Montgomery has a history of a multinodular goiter. She has had previous ultrasounds of this and a prior biopsy. She notes she was told that she did not need further follow-up with the endocrinologist. She does not feel this has been increasing in size.  Past Medical History: Patient Active Problem List   Diagnosis Date Noted   Multinodular goiter 09/25/2014   Past Surgical History:  Procedure Laterality Date   BLADDER SUSPENSION N/A 02/12/2017   Procedure: TRANSVAGINAL TAPE (TVT) PROCEDURE;  Surgeon: Patton Salles, MD;  Location: [facility];  Service: Gynecology;  Laterality: N/A;   COLONOSCOPY WITH PROPOFOL  2015   CYSTOSCOPY N/A 02/12/2017   Procedure: CYSTOSCOPY;  Surgeon: Patton Salles, MD;  Location: [facility];  Service: Gynecology;  Laterality: N/A;   DILATATION & CURETTAGE/HYSTEROSCOPY WITH MYOSURE N/A 09/29/2016   Procedure: DILATATION & CURETTAGE/HYSTEROSCOPY, VULVA BIOPSIES;  Surgeon: Jerene Bears, MD;  Location: [facility];  Service: Gynecology;  Laterality: N/A;   LAPAROSCOPIC BILATERAL SALPINGO  OOPHERECTOMY Bilateral 02/12/2017   Procedure: LAPAROSCOPIC BILATERAL SALPINGO OOPHORECTOMY;  Surgeon: Jerene Bears, MD;  Location: [facility];  Service: Gynecology;  Laterality: Bilateral;  possible   LAPAROSCOPIC HYSTERECTOMY N/A 02/12/2017   Procedure: HYSTERECTOMY TOTAL LAPAROSCOPIC;  Surgeon: Jerene Bears, MD;  Location: [facility];  Service: Gynecology;  Laterality: N/A;   PUBOVAGINAL SLING N/A 02/12/2017   Procedure: mid-uretheral SLING;  Surgeon: Patton Salles, MD;  Location: [facility];  Service: Gynecology;  Laterality: N/A;   TONSILLECTOMY AND ADENOIDECTOMY  child   WISDOM TOOTH EXTRACTION  20 approx.   Family History  Problem Relation Age of Onset   Hyperlipidemia Mother    Hypertension Mother    Lung disease Father    Cancer Sister        Hodgkin's lymphoma   Stroke Maternal Aunt    Thyroid cancer Paternal Grandmother    Thyroid disease Neg Hx    Outpatient Medications Prior to Visit  Medication Sig Dispense Refill   Omega-3 Fatty Acids (FISH OIL) 1000 MG CAPS Take 1 capsule by mouth daily.     No facility-administered medications prior to visit.   No Known Allergies Objective:   Today's Vitals   11/15/22 0820  BP: 130/82  Pulse: 70  Temp: 98 F (36.7 C)  TempSrc: Temporal  SpO2: 97%  Weight: 137 lb (62.1 kg)  Height: 5' 2.5" (1.588 m)   Body mass index is 24.66 kg/m.   General: Well developed, well nourished. No acute distress. Psych: Alert and oriented. Normal mood and affect.  Health Maintenance  Due  Topic Date Due   HIV Screening  Never done   Hepatitis C Screening  Never done   Colonoscopy  Never done   Zoster Vaccines- Shingrix (1 of 2) Never done   PAP SMEAR-Modifier  09/16/2019   DTaP/Tdap/Td (2 - Td or Tdap) 05/10/2020   MAMMOGRAM  12/22/2021   Imaging: Thyroid US (10/24/2019) IMPRESSION: 1. Borderline thyromegaly with bilateral nodules. None meets criteria for biopsy. 2. Recommend annual/biennial ultrasound follow-up of mid left nodule  as above, until stability x5 years confirmed.    Assessment & Plan:   Problem List Items Addressed This Visit       Endocrine   Multinodular goiter - Primary    Patient has had documented stability of thyroid nodules for 5 years. We discussed that there was not an indication for further evaluation without signs or symptoms of some change in the status of the thyroid.      Other Visit Diagnoses     Need for Td vaccine       Relevant Orders   Td vaccine greater than or equal to 7yo preservative free IM (Completed)   Encounter for hepatitis C screening test for low risk patient       Relevant Orders   HCV Ab w Reflex to Quant PCR   Encounter for screening mammogram for malignant neoplasm of breast       Relevant Orders   MM DIGITAL SCREENING BILATERAL   Screening for lipid disorders       Relevant Orders   Lipid panel       No follow-ups on file.   Loyola Mast, MD

## 2022-12-15 ENCOUNTER — Telehealth: Payer: Self-pay | Admitting: Family Medicine

## 2022-12-15 NOTE — Telephone Encounter (Signed)
Please call pt back and request that she contact [facility] Billing Customer Service at [phone]. It appears she has not met her deductible and the visit fee was directed to self pay. CPE is not completed at 1st visit. Pt was notified of this via mychart on 10/24/2022.

## 2022-12-15 NOTE — Telephone Encounter (Signed)
Pt is calling asking why she received a high bill for her NP appt.

## 2022-12-17 ENCOUNTER — Encounter: Payer: Self-pay | Admitting: Family Medicine

## 2022-12-18 NOTE — Telephone Encounter (Signed)
Sent text to call billing.

## 2022-12-18 NOTE — Telephone Encounter (Signed)
Pt called back stating she was returning call to [facility] about billing. Pt was advised of below and stated that her husband was seen around the same time and all of his was covered by insurance. Advised that each visit varies and pending past medical history, labs, imaging, etc there was documentation during her visit that supports establishing care with Dr. Veto Kemps and follow up on medical history. Advised pt physical was not completed for her. She cancelled lab appt 7/31 and said she will pay bill but needs to think this through.

## 2022-12-18 NOTE — Telephone Encounter (Signed)
Lft VM to rtn call. Dm/cma  

## 2022-12-20 ENCOUNTER — Other Ambulatory Visit: Payer: BC Managed Care – PPO

## 2022-12-26 ENCOUNTER — Ambulatory Visit
Admission: RE | Admit: 2022-12-26 | Discharge: 2022-12-26 | Disposition: A | Discharge status: Home / Self Care | Payer: BC Managed Care – PPO | Source: Ambulatory Visit | Attending: Family Medicine | Admitting: Family Medicine

## 2022-12-26 DIAGNOSIS — Z1231 Encounter for screening mammogram for malignant neoplasm of breast: Secondary | ICD-10-CM | POA: Diagnosis not present

## 2023-01-24 DIAGNOSIS — L821 Other seborrheic keratosis: Secondary | ICD-10-CM | POA: Diagnosis not present

## 2023-01-24 DIAGNOSIS — L720 Epidermal cyst: Secondary | ICD-10-CM | POA: Diagnosis not present

## 2023-03-19 DIAGNOSIS — H2513 Age-related nuclear cataract, bilateral: Secondary | ICD-10-CM | POA: Diagnosis not present

## 2023-03-19 DIAGNOSIS — H43812 Vitreous degeneration, left eye: Secondary | ICD-10-CM | POA: Diagnosis not present

## 2023-03-19 DIAGNOSIS — Z83511 Family history of glaucoma: Secondary | ICD-10-CM | POA: Diagnosis not present

## 2023-03-19 DIAGNOSIS — H40023 Open angle with borderline findings, high risk, bilateral: Secondary | ICD-10-CM | POA: Diagnosis not present

## 2023-10-19 ENCOUNTER — Encounter: Admitting: Family Medicine

## 2023-12-04 ENCOUNTER — Encounter: Admitting: Family Medicine

## 2023-12-11 ENCOUNTER — Other Ambulatory Visit: Payer: Self-pay | Admitting: Family Medicine

## 2023-12-11 DIAGNOSIS — Z1231 Encounter for screening mammogram for malignant neoplasm of breast: Secondary | ICD-10-CM

## 2023-12-25 ENCOUNTER — Encounter: Admitting: Family Medicine

## 2023-12-31 ENCOUNTER — Ambulatory Visit
Admission: RE | Admit: 2023-12-31 | Discharge: 2023-12-31 | Disposition: A | Discharge status: Home / Self Care | Source: Ambulatory Visit | Attending: Family Medicine | Admitting: Family Medicine

## 2023-12-31 DIAGNOSIS — Z1231 Encounter for screening mammogram for malignant neoplasm of breast: Secondary | ICD-10-CM

## 2024-01-31 ENCOUNTER — Encounter: Payer: Self-pay | Admitting: Family Medicine

## 2024-01-31 ENCOUNTER — Ambulatory Visit (INDEPENDENT_AMBULATORY_CARE_PROVIDER_SITE_OTHER): Admitting: Family Medicine

## 2024-01-31 VITALS — BP 130/82 | HR 81 | Temp 97.9°F | Ht 62.5 in | Wt 135.0 lb

## 2024-01-31 DIAGNOSIS — Z0000 Encounter for general adult medical examination without abnormal findings: Secondary | ICD-10-CM | POA: Diagnosis not present

## 2024-01-31 NOTE — Assessment & Plan Note (Signed)
 Overall health is excellent. Recommend ongoing regular exercise. Discussed recommended screenings and immunizations. She had a colonoscopy set up in August, but then a conflict arose. She plans to reschedule this. Discussed zoster and pneumococcal vaccines. She wants to think more about these.

## 2024-01-31 NOTE — Progress Notes (Signed)
 [facility] [address] [place] [address] Dept: [phone] Dept Fax: [phone]  Annual Physical Visit  Subjective:    Patient ID: Emily Montgomery, female    DOB: 1961/04/13, 63 y.o..   MRN: [id number]  Chief Complaint  Patient presents with   Annual Exam   History of Present Illness:  Patient is in today for an annual physical/preventative visit.  Ms. Pelzer has started routine exercise int he past 2 months. she feels this has been a positive step for her health.  Review of Systems  Constitutional:  Negative for chills, diaphoresis, fever, malaise/fatigue and weight loss.  HENT:  Negative for congestion, ear pain, hearing loss, sinus pain, sore throat and tinnitus.   Eyes:  Negative for blurred vision, pain, discharge and redness.  Respiratory:  Negative for cough, shortness of breath and wheezing.   Cardiovascular:  Negative for chest pain and palpitations.  Gastrointestinal:  Negative for abdominal pain, constipation, diarrhea, heartburn, nausea and vomiting.  Musculoskeletal:  Negative for back pain, joint pain and myalgias.  Skin:  Negative for itching and rash.  Psychiatric/Behavioral:  Negative for depression. The patient is not nervous/anxious.    Past Medical History: Patient Active Problem List   Diagnosis Date Noted   Multinodular goiter 09/25/2014   Past Surgical History:  Procedure Laterality Date   BLADDER SUSPENSION N/A 02/12/2017   Procedure: TRANSVAGINAL TAPE (TVT) PROCEDURE;  Surgeon: Cathlyn JAYSON Nikki Bobie FORBES, MD;  Location: [facility];  Service: Gynecology;  Laterality: N/A;   COLONOSCOPY WITH PROPOFOL   2015   CYSTOSCOPY N/A 02/12/2017   Procedure: CYSTOSCOPY;  Surgeon: Cathlyn JAYSON Nikki Bobie FORBES, MD;  Location: [facility];  Service: Gynecology;  Laterality: N/A;   DILATATION & CURETTAGE/HYSTEROSCOPY WITH MYOSURE N/A 09/29/2016   Procedure: DILATATION & CURETTAGE/HYSTEROSCOPY, VULVA BIOPSIES;   Surgeon: Cleotilde Ronal RAMAN, MD;  Location: [facility] [place];  Service: Gynecology;  Laterality: N/A;   LAPAROSCOPIC BILATERAL SALPINGO OOPHERECTOMY Bilateral 02/12/2017   Procedure: LAPAROSCOPIC BILATERAL SALPINGO OOPHORECTOMY;  Surgeon: Cleotilde Ronal RAMAN, MD;  Location: [facility];  Service: Gynecology;  Laterality: Bilateral;  possible   LAPAROSCOPIC HYSTERECTOMY N/A 02/12/2017   Procedure: HYSTERECTOMY TOTAL LAPAROSCOPIC;  Surgeon: Cleotilde Ronal RAMAN, MD;  Location: [facility];  Service: Gynecology;  Laterality: N/A;   PUBOVAGINAL SLING N/A 02/12/2017   Procedure: mid-uretheral SLING;  Surgeon: Cathlyn JAYSON Nikki Bobie FORBES, MD;  Location: [facility];  Service: Gynecology;  Laterality: N/A;   TONSILLECTOMY AND ADENOIDECTOMY  child   TOTAL ABDOMINAL HYSTERECTOMY     WISDOM TOOTH EXTRACTION  20 approx.   Family History  Problem Relation Age of Onset   Hyperlipidemia Mother    Hypertension Mother    Lung disease Father    Cancer Sister        Hodgkin's lymphoma   Stroke Maternal Aunt    Thyroid  cancer Paternal Grandmother    Thyroid  disease Neg Hx    Outpatient Medications Prior to Visit  Medication Sig Dispense Refill   Omega-3 Fatty Acids (FISH OIL) 1000 MG CAPS Take 1 capsule by mouth daily.     No facility-administered medications prior to visit.   No Known Allergies Objective:   Today's Vitals   01/31/24 0802  BP: 130/82  Pulse: 81  Temp: 97.9 F (36.6 C)  TempSrc: Temporal  SpO2: 99%  Weight: 135 lb (61.2 kg)  Height: 5' 2.5 (1.588 m)   Body mass index is 24.3 kg/m.   General: Well developed, well nourished.  No acute distress. HEENT: Normocephalic, non-traumatic. PERRL, EOMI. Conjunctiva clear. External ears   normal. EAC and TMs normal bilaterally. Nose clear without congestion or rhinorrhea. Mucous membranes moist. Oropharynx clear. Good dentition. Neck: Supple. No lymphadenopathy. No thyromegaly. Lungs: Clear to auscultation bilaterally. No wheezing, rales or rhonchi. CV:  RRR without murmurs or rubs. Pulses 2+ bilaterally. Abdomen: Soft, non-tender. Bowel sounds positive, normal pitch and frequency. No   hepatosplenomegaly. No rebound or guarding. Extremities: Full ROM. No joint swelling or tenderness. No edema noted. Skin: Warm and dry. No rashes. Psych: Alert and oriented. Normal mood and affect.  Health Maintenance Due  Topic Date Due   HIV Screening  Never done   Hepatitis C Screening  Never done   Pneumococcal Vaccine: 50+ Years (1 of 1 - PCV) Never done   Zoster Vaccines- Shingrix (1 of 2) Never done   Colonoscopy  09/20/2023     Assessment & Plan:   Problem List Items Addressed This Visit       Other   Annual physical exam - Primary   Overall health is excellent. Recommend ongoing regular exercise. Discussed recommended screenings and immunizations. She had a colonoscopy set up in August, but then a conflict arose. She plans to reschedule this. Discussed zoster and pneumococcal vaccines. She wants to think more about these.        Return in about 1 year (around 01/30/2025) for Annual preventative care.   Garnette CHRISTELLA Simpler, MD

## 2024-03-20 ENCOUNTER — Encounter (HOSPITAL_BASED_OUTPATIENT_CLINIC_OR_DEPARTMENT_OTHER): Payer: Self-pay

## 2024-03-20 ENCOUNTER — Other Ambulatory Visit: Payer: Self-pay

## 2024-03-20 ENCOUNTER — Emergency Department (HOSPITAL_BASED_OUTPATIENT_CLINIC_OR_DEPARTMENT_OTHER)
Admission: EM | Admit: 2024-03-20 | Discharge: 2024-03-20 | Disposition: A | Discharge status: Home / Self Care | Source: Ambulatory Visit | Attending: Emergency Medicine | Admitting: Emergency Medicine

## 2024-03-20 ENCOUNTER — Ambulatory Visit

## 2024-03-20 ENCOUNTER — Ambulatory Visit: Payer: Self-pay

## 2024-03-20 VITALS — BP 229/117 | HR 78

## 2024-03-20 DIAGNOSIS — R030 Elevated blood-pressure reading, without diagnosis of hypertension: Secondary | ICD-10-CM

## 2024-03-20 DIAGNOSIS — Z1211 Encounter for screening for malignant neoplasm of colon: Secondary | ICD-10-CM | POA: Diagnosis not present

## 2024-03-20 DIAGNOSIS — I10 Essential (primary) hypertension: Secondary | ICD-10-CM | POA: Diagnosis not present

## 2024-03-20 DIAGNOSIS — K5730 Diverticulosis of large intestine without perforation or abscess without bleeding: Secondary | ICD-10-CM | POA: Diagnosis not present

## 2024-03-20 LAB — BASIC METABOLIC PANEL WITH GFR
Anion gap: 12 (ref 5–15)
BUN: 15 mg/dL (ref 8–23)
CO2: 24 mmol/L (ref 22–32)
Calcium: 9.4 mg/dL (ref 8.9–10.3)
Chloride: 104 mmol/L (ref 98–111)
Creatinine, Ser: 0.63 mg/dL (ref 0.44–1.00)
GFR, Estimated: >60 mL/min (ref >60)
Glucose, Bld: 119 mg/dL — ABNORMAL HIGH (ref 70–99)
Potassium: 3.6 mmol/L (ref 3.5–5.1)
Sodium: 140 mmol/L (ref 135–145)

## 2024-03-20 LAB — CBC
HCT: 44.8 % (ref 36.0–46.0)
Hemoglobin: 14.8 g/dL (ref 12.0–15.0)
MCH: 30.5 pg (ref 26.0–34.0)
MCHC: 33 g/dL (ref 30.0–36.0)
MCV: 92.4 fL (ref 80.0–100.0)
Platelets: 225 K/uL (ref 150–400)
RBC: 4.85 MIL/uL (ref 3.87–5.11)
RDW: 12.9 % (ref 11.5–15.5)
WBC: 5.6 K/uL (ref 4.0–10.5)
nRBC: 0 % (ref 0.0–0.2)

## 2024-03-20 MED ORDER — AMLODIPINE BESYLATE 5 MG PO TABS: 5.0000 mg | ORAL_TABLET | Freq: Every day | ORAL | 0 refills | Status: DC

## 2024-03-20 MED ORDER — AMLODIPINE BESYLATE 5 MG PO TABS
10.0000 mg | ORAL_TABLET | Freq: Once | ORAL | Status: AC
  Administered 2024-03-20: 10 mg via ORAL
  Filled 2024-03-20: qty 2

## 2024-03-20 NOTE — Discharge Instructions (Signed)
 As we discussed, though you do have high blood pressure (hypertension), fortunately it is not immediately dangerous at this time and does not need emergency intervention or admission to the hospital.  I am starting you on some blood pressure medication. Please follow up in clinic as recommended in these papers.    Return to the Emergency Department (ED) if you experience any worsening chest pain/pressure/tightness, difficulty breathing, or sudden sweating, or other symptoms that concern you.

## 2024-03-20 NOTE — ED Provider Notes (Signed)
 Emergency Department Provider Note   I have reviewed the triage vital signs and the nursing notes.   HISTORY  Chief Complaint Hypertension   HPI Emily Montgomery is a 63 y.o. female with past reviewed below presents to the emergency department with asymptomatic hypertension.  She was going to receive medical screening for a colonoscopy today and was found to be hypertensive.  Systolic blood pressure in the 180s.  She was referred to a primary care doctor who also evaluated her today and advised that she present to the emergency department.  She is having no symptoms.  She specifically denies any headache, vision changes, numbness/weakness, chest pain, shortness of breath.  She notes that she does not see the doctor regularly and never checks her blood pressure at home.  She does not take any prescription medications.   Past Medical History:  Diagnosis Date   Family history of adverse reaction to anesthesia    sister- ponv   Multinodular thyroid     goiter--- per pt consulted w/ endocrinologist, told benign cyst   Thickened endometrium    and endometrial irregularity    Review of Systems  Constitutional: No fever/chills Cardiovascular: Denies chest pain. Respiratory: Denies shortness of breath. Gastrointestinal: No abdominal pain.  No nausea, no vomiting.   Musculoskeletal: Negative for back pain. Skin: Negative for rash. Neurological: Negative for headaches, focal weakness or numbness.   ____________________________________________   PHYSICAL EXAM:  VITAL SIGNS: ED Triage Vitals  Encounter Vitals Group     BP 03/20/24 1130 (!) 240/123     Pulse Rate 03/20/24 1130 92     Resp 03/20/24 1130 18     Temp 03/20/24 1130 97.9 F (36.6 C)     Temp Source 03/20/24 1130 Oral     SpO2 03/20/24 1130 100 %   Constitutional: Alert and oriented. Well appearing and in no acute distress. Eyes: Conjunctivae are normal.  Head: Atraumatic. Nose: No  congestion/rhinnorhea. Mouth/Throat: Mucous membranes are moist.   Neck: No stridor.   Cardiovascular: Normal rate, regular rhythm. Good peripheral circulation. Grossly normal heart sounds.   Respiratory: Normal respiratory effort.  No retractions. Lungs CTAB. Gastrointestinal: Soft and nontender. No distention.  Musculoskeletal: No lower extremity tenderness nor edema. No gross deformities of extremities. Neurologic:  Normal speech and language. No gross focal neurologic deficits are appreciated.  Skin:  Skin is warm, dry and intact. No rash noted.   ____________________________________________   LABS (all labs ordered are listed, but only abnormal results are displayed)  Labs Reviewed  BASIC METABOLIC PANEL WITH GFR - Abnormal; Notable for the following components:      Result Value   Glucose, Bld 119 (*)    All other components within normal limits  CBC   ____________________________________________   PROCEDURES  Procedure(s) performed:   Procedures  None  ____________________________________________   INITIAL IMPRESSION / ASSESSMENT AND PLAN / ED COURSE  Pertinent labs & imaging results that were available during my care of the patient were reviewed by me and considered in my medical decision making (see chart for details).   This patient is Presenting for Evaluation of BP elevated, which does require a range of treatment options, and is a complaint that involves a high risk of morbidity and mortality.  The Differential Diagnoses asymptomatic hypertension, hypertensive emergency, stress, etc.   I decided to review pertinent External Data, and in summary patient went for a nurse blood pressure appointment today in the office with manual blood pressures showing systolic 160.  Clinical Laboratory Tests Ordered, included basic metabolic panel shows normal creatinine.  CBC without leukocytosis or anemia.  Cardiac Monitor Tracing which shows NSR   Social Determinants  of Health Risk patient is a non-smoker.   Medical Decision Making: Summary:  The patient presents the emergency department with asymptomatic hypertension.  Automatic blood pressure on arrival very high but patient without symptoms.  Notes that she is feeling stress at being diverted to the emergency department.  Plan for screening blood work.  No acute intervention on blood pressure.  Will continue to monitor pressures here.   Reevaluation with update and discussion with patient.  Blood pressure downtrending slightly.  Continues to be asymptomatic.  Plan to start amlodipine both here in the department and at home.  She has a PCP with close follow-up. Stable for discharge.   Considered admission but no evidence of acute hypertensive emergency or endorgan damage requiring admit.  Patient's presentation is most consistent with acute presentation with potential threat to life or bodily function.   Disposition: discharge  ____________________________________________  FINAL CLINICAL IMPRESSION(S) / ED DIAGNOSES  Final diagnoses:  Asymptomatic hypertension     NEW OUTPATIENT MEDICATIONS STARTED DURING THIS VISIT:  Discharge Medication List as of 03/20/2024 12:41 PM     START taking these medications   Details  amLODipine (NORVASC) 5 MG tablet Take 1 tablet (5 mg total) by mouth daily., Starting Thu 03/20/2024, Normal        Note:  This document was prepared using Dragon voice recognition software and may include unintentional dictation errors.  Fonda Law, MD, [facility]    Roarke Marciano, Fonda MATSU, MD 03/24/24 [phone]

## 2024-03-20 NOTE — ED Triage Notes (Signed)
 Reports elevated BP this morning. Sent by PCP. States systolic was in 180's.  Denies chest pain, SHOB, headache, visual changes.

## 2024-03-20 NOTE — Progress Notes (Signed)
   Blood Pressure Recheck Visit  Name: Emily Montgomery MRN: [id number] Date of Birth: 19-Aug-1960  Emily Montgomery presents today for Blood Pressure recheck with clinical support staff.   Order for BP recheck by Emily Montgomery, ordered on 03/20/2024.   BP this visit: 160/100 @ 1003 - manual, HR 68 162/100 @ 1008 - manual, HR 78 229/117 @ 1014 - electric cuff  BP Readings from Last 3 Encounters:  01/31/24 130/82  11/15/22 130/82  11/13/19 130/88    Current Outpatient Medications  Medication Sig Dispense Refill   Omega-3 Fatty Acids (FISH OIL) 1000 MG CAPS Take 1 capsule by mouth daily.     No current facility-administered medications for this visit.    Hypertensive Medication Review: Patient is not prescribed any hypertensive medications. No diagnosis of hypertension.  Documentation of any medication adherence discrepancies: N/A  Provider Recommendation:  Spoke to Emily Montgomery, as Emily Montgomery is in an exam room with a pt, and she stated: adivse pt to seek evaluation at the emergency room for elevated BP readings and no history of HTN.  Patient has been scheduled to follow up with Dr. Sebastian on 03/21/24.   Patient has been given provider's recommendations. Pt not sure if she needs to go to the ER at this time. Reports no symptoms. I explained that we would rather be safe than sorry and have her go for immediate evaluation. Patient will keep the appt scheduled with Dr. Sebastian tomorrow and contact the office today for any future questions or concerns.

## 2024-03-20 NOTE — Telephone Encounter (Signed)
 FYI Only or Action Required?: FYI only for provider: appointment scheduled on tomorrow morning and pt will come in today for a nurse visit for BP check..  Patient was last seen in primary care on 01/31/2024 by Thedora Garnette HERO, MD.  Called Nurse Triage reporting Hypertension.  Symptoms began today.  Interventions attempted: Nothing.  Symptoms are: unchanged.  Triage Disposition: See Physician Within 24 Hours  Patient/caregiver understands and will follow disposition?: Yes                        Copied from CRM #[id number]. Topic: Clinical - Red Word Triage >> Mar 20, 2024  9:13 AM Deaijah H wrote: Red Word that prompted transfer to Nurse Triage: BP very high 191/unsure of bottom Reason for Disposition  Systolic BP >= 180 OR Diastolic >= 110  Answer Assessment - Initial Assessment Questions Pt was seen this morning at GI to set up colonoscopy appt. At that office pt had elevated BP readings. Pt stated one was 181 over something.  Pt said she thinks one may have been over 200 - but is not sure. Appt scheduled for tomorrow morning. Pt wants to come by the office today to have BP taken. Called CAL s/w Carmen - Conferenced Carmen in to schedule nurse visit for today.  1. BLOOD PRESSURE: What is your blood pressure? Did you take at least two measurements 5 minutes apart?     yes 2. ONSET: When did you take your blood pressure?     At provider's office 3. HOW: How did you take your blood pressure? (e.g., automatic home BP monitor, visiting nurse)     Machine 4. HISTORY: Do you have a history of high blood pressure?     no 5. MEDICINES: Are you taking any medicines for blood pressure? Have you missed any doses recently?     no 6. OTHER SYMPTOMS: Do you have any symptoms? (e.g., blurred vision, chest pain, difficulty breathing, headache, weakness)     No - feels fine  Protocols used: Blood Pressure - High-A-AH

## 2024-03-20 NOTE — Telephone Encounter (Signed)
 Noted. Dm/cma

## 2024-03-21 ENCOUNTER — Ambulatory Visit: Admitting: Family Medicine

## 2024-03-21 ENCOUNTER — Telehealth: Payer: Self-pay

## 2024-03-21 NOTE — Transitions of Care (Post Inpatient/ED Visit) (Signed)
   03/21/2024  Name: Emily Montgomery MRN: [id number] DOB: 09-16-1960  Today's TOC FU Call Status: Today's TOC FU Call Status:: Successful TOC FU Call Completed TOC FU Call Complete Date: 03/21/24 Patient's Name and Date of Birth confirmed.  Transition Care Management Follow-up Telephone Call Date of Discharge: 03/20/24 Type of Discharge: Emergency Department  Items Reviewed: Did you receive and understand the discharge instructions provided?: Yes Medications obtained,verified, and reconciled?: Yes (Medications Reviewed) Any new allergies since your discharge?: No Dietary orders reviewed?: No Do you have support at home?: Yes  Medications Reviewed Today: Medications Reviewed Today   Medications were not reviewed in this encounter     Home Care and Equipment/Supplies: Were Home Health Services Ordered?: No Any new equipment or medical supplies ordered?: No  Functional Questionnaire: Do you need assistance with bathing/showering or dressing?: No Do you need assistance with meal preparation?: No Do you need assistance with eating?: No Do you have difficulty maintaining continence: No Do you need assistance with getting out of bed/getting out of a chair/moving?: No Do you have difficulty managing or taking your medications?: No  Follow up appointments reviewed: PCP Follow-up appointment confirmed?: Yes Date of PCP follow-up appointment?: 03/25/24 Follow-up Provider: [facility] Follow-up appointment confirmed?: NA Do you need transportation to your follow-up appointment?: No    SIGNATURE Dedra Sorenson, BSN, RN

## 2024-03-24 ENCOUNTER — Telehealth: Payer: Self-pay

## 2024-03-24 NOTE — Telephone Encounter (Signed)
 Left VM to RTN call. Has appointment on 03/25/24 @ 11:00 am.  Dm/cma

## 2024-03-24 NOTE — Telephone Encounter (Signed)
 Copied from CRM [id number]. Topic: Clinical - Medication Question >> Mar 21, 2024  4:34 PM Ashley R wrote: Reason for CRM: callback from call earlier. Would like to know if there is a lower end for BP she should be concerned about as she takes her medication. Callback [phone] or message.

## 2024-03-25 ENCOUNTER — Ambulatory Visit (INDEPENDENT_AMBULATORY_CARE_PROVIDER_SITE_OTHER): Admitting: Family Medicine

## 2024-03-25 ENCOUNTER — Encounter: Payer: Self-pay | Admitting: Family Medicine

## 2024-03-25 VITALS — BP 136/84 | HR 56 | Temp 97.5°F | Ht 62.5 in | Wt 134.2 lb

## 2024-03-25 DIAGNOSIS — I517 Cardiomegaly: Secondary | ICD-10-CM | POA: Insufficient documentation

## 2024-03-25 DIAGNOSIS — I10 Essential (primary) hypertension: Secondary | ICD-10-CM | POA: Insufficient documentation

## 2024-03-25 DIAGNOSIS — K5730 Diverticulosis of large intestine without perforation or abscess without bleeding: Secondary | ICD-10-CM | POA: Insufficient documentation

## 2024-03-25 MED ORDER — AMLODIPINE BESYLATE 5 MG PO TABS: 5.0000 mg | ORAL_TABLET | Freq: Every day | ORAL | 3 refills | Status: AC

## 2024-03-25 NOTE — Progress Notes (Signed)
 [facility] [address] [place] [address] Dept: [phone] Dept Fax: [phone]  Office Visit  Subjective:    Patient ID: Emily Montgomery, female    DOB: [birth year], 63 y.o..   MRN: [id number]  Chief Complaint  Patient presents with   Hypertension    F/u elevated BP.   Average BP  131/80 - 129/78.    History of Present Illness:  Patient is in today for Emergency Department Follow-up.  Emily Montgomery was seen in the emergency department on 03/20/2024 for asymptomatic hypertension after her systolic blood pressure was noted to be in the 180s in-office. At the ED, her BP 240/123. Her initial evaluation was reported to be benign. She was discharged home with amlodipine 5 mg daily. Emily Montgomery does have family history of hypertension. She is not aware that this had ever been a problem. She is trying to watch her salt intake. She does not drink alcohol. She limites caffeine intake. She is looking to resume an exercise routine.  Past Medical History: Patient Active Problem List   Diagnosis Date Noted   Annual physical exam 01/31/2024   Multinodular goiter 09/25/2014   Past Surgical History:  Procedure Laterality Date   BLADDER SUSPENSION N/A 02/12/2017   Procedure: TRANSVAGINAL TAPE (TVT) PROCEDURE;  Surgeon: Cathlyn JAYSON Nikki Bobie FORBES, MD;  Location: [facility];  Service: Gynecology;  Laterality: N/A;   COLONOSCOPY WITH PROPOFOL   2015   CYSTOSCOPY N/A 02/12/2017   Procedure: CYSTOSCOPY;  Surgeon: Cathlyn JAYSON Nikki Bobie FORBES, MD;  Location: [facility];  Service: Gynecology;  Laterality: N/A;   DILATATION & CURETTAGE/HYSTEROSCOPY WITH MYOSURE N/A 09/29/2016   Procedure: DILATATION & CURETTAGE/HYSTEROSCOPY, VULVA BIOPSIES;  Surgeon: Cleotilde Ronal RAMAN, MD;  Location: [facility];  Service: Gynecology;  Laterality: N/A;   LAPAROSCOPIC BILATERAL SALPINGO OOPHERECTOMY Bilateral 02/12/2017   Procedure: LAPAROSCOPIC BILATERAL  SALPINGO OOPHORECTOMY;  Surgeon: Cleotilde Ronal RAMAN, MD;  Location: [facility];  Service: Gynecology;  Laterality: Bilateral;  possible   LAPAROSCOPIC HYSTERECTOMY N/A 02/12/2017   Procedure: HYSTERECTOMY TOTAL LAPAROSCOPIC;  Surgeon: Cleotilde Ronal RAMAN, MD;  Location: [facility];  Service: Gynecology;  Laterality: N/A;   PUBOVAGINAL SLING N/A 02/12/2017   Procedure: mid-uretheral SLING;  Surgeon: Cathlyn JAYSON Nikki Bobie FORBES, MD;  Location: [facility];  Service: Gynecology;  Laterality: N/A;   TONSILLECTOMY AND ADENOIDECTOMY  child   TOTAL ABDOMINAL HYSTERECTOMY     WISDOM TOOTH EXTRACTION  20 approx.   Family History  Problem Relation Age of Onset   Hyperlipidemia Mother    Hypertension Mother    Lung disease Father    Cancer Sister        Hodgkin's lymphoma   Stroke Maternal Aunt    Thyroid  cancer Paternal Grandmother    Thyroid  disease Neg Hx    Outpatient Medications Prior to Visit  Medication Sig Dispense Refill   amLODipine (NORVASC) 5 MG tablet Take 1 tablet (5 mg total) by mouth daily. 30 tablet 0   Omega-3 Fatty Acids (FISH OIL) 1000 MG CAPS Take 1 capsule by mouth daily.     No facility-administered medications prior to visit.   No Known Allergies   Objective:   Today's Vitals   03/25/24 1101  BP: 136/84  Pulse: (!) 56  Temp: (!) 97.5 F (36.4 C)  TempSrc: Temporal  SpO2: 94%  Weight: 134 lb 3.2 oz (60.9 kg)  Height: 5' 2.5 (1.588 m)   Body mass index is 24.15 kg/m.   General:  Well developed, well nourished. No acute distress. CV: RRR without murmurs or rubs. Pulses 2+ bilaterally. Extremities: No edema noted. Psych: Alert and oriented. Normal mood and affect.  Health Maintenance Due  Topic Date Due   HIV Screening  Never done   Hepatitis C Screening  Never done   Pneumococcal Vaccine: 50+ Years (1 of 1 - PCV) Never done   Zoster Vaccines- Shingrix (1 of 2) Never done   Colonoscopy  09/20/2023   COVID-19 Vaccine (1 - 2025-26 season) Never done   Lab Results    Latest  Ref Rng & Units 03/20/2024   11:41 AM 06/17/2018    4:38 PM 02/13/2017    6:22 AM  CBC  WBC 4.0 - 10.5 K/uL 5.6  6.1    Hemoglobin 12.0 - 15.0 g/dL 85.1  86.4  88.9   Hematocrit 36.0 - 46.0 % 44.8  39.3    Platelets 150 - 400 K/uL 225  242        Latest Ref Rng & Units 03/20/2024   11:41 AM 06/17/2018    4:38 PM 01/31/2017    9:20 AM  CMP  Glucose 70 - 99 mg/dL 880  89  97   BUN 8 - 23 mg/dL 15  12  18    Creatinine 0.44 - 1.00 mg/dL 9.36  9.37  9.24   Sodium 135 - 145 mmol/L 140  142  139   Potassium 3.5 - 5.1 mmol/L 3.6  4.3  4.7   Chloride 98 - 111 mmol/L 104  102  106   CO2 22 - 32 mmol/L 24  26  27    Calcium 8.9 - 10.3 mg/dL 9.4  9.4  9.3   Total Protein 6.0 - 8.5 g/dL  6.8    Total Bilirubin 0.0 - 1.2 mg/dL  0.3    Alkaline Phos 39 - 117 IU/L  85    AST 0 - 40 IU/L  12    ALT 0 - 32 IU/L  17     EKG (03/20/2024)- Normal sinus rhythm. Biatrial enlargement    Assessment & Plan:   Problem List Items Addressed This Visit       Cardiovascular and Mediastinum   Biatrial enlargement   Relevant Medications   amLODipine (NORVASC) 5 MG tablet   Other Relevant Orders   Ambulatory referral to Cardiology   Essential hypertension - Primary   Emily Montgomery presented with markedly high BPs, but was having no clinical symptoms. She is now on amlodipine 5 mg daily. Her average morning BP at home is 136/79. I suspect she has Stage 1 hypertension, but  a strong component of white coat hypertension that was at play the day she went for her pre-colonoscopy visit and the ED. I recommend we continue amlodipine 5 mg daily. I will check her thyroid  function to assure that this is not involved. As her EKG shows evidence of biatrial enlargement, I will refer her to cardiology to consider an echocardiogram for further evaluation of this. We did discuss non-pharmacologic approaches to lower blood pressure. I will see her back in a month.      Relevant Medications   amLODipine (NORVASC) 5 MG  tablet   Other Relevant Orders   TSH   T4, free   Ambulatory referral to Cardiology    Return in about 4 weeks (around 04/22/2024) for Reassessment.    Garnette CHRISTELLA Simpler, MD  I,Emily Lagle,acting as a scribe for Garnette CHRISTELLA Simpler, MD.,have documented all relevant documentation on the  behalf of Garnette CHRISTELLA Simpler, MD.  I, Garnette CHRISTELLA Simpler, MD, have reviewed all documentation for this visit. The documentation on 03/25/2024 for the exam, diagnosis, procedures, and orders are all accurate and complete.

## 2024-03-25 NOTE — Assessment & Plan Note (Signed)
 Emily Montgomery presented with markedly high BPs, but was having no clinical symptoms. She is now on amlodipine 5 mg daily. Her average morning BP at home is 136/79. I suspect she has Stage 1 hypertension, but  a strong component of white coat hypertension that was at play the day she went for her pre-colonoscopy visit and the ED. I recommend we continue amlodipine 5 mg daily. I will check her thyroid  function to assure that this is not involved. As her EKG shows evidence of biatrial enlargement, I will refer her to cardiology to consider an echocardiogram for further evaluation of this. We did discuss non-pharmacologic approaches to lower blood pressure. I will see her back in a month.

## 2024-03-26 ENCOUNTER — Ambulatory Visit: Payer: Self-pay | Admitting: Family Medicine

## 2024-03-26 LAB — T4, FREE: Free T4: 0.87 ng/dL (ref 0.60–1.60)

## 2024-03-26 LAB — TSH: TSH: 0.61 u[IU]/mL (ref 0.35–5.50)

## 2024-03-28 ENCOUNTER — Ambulatory Visit
Attending: Student in an Organized Health Care Education/Training Program | Admitting: Student in an Organized Health Care Education/Training Program

## 2024-03-28 ENCOUNTER — Encounter: Payer: Self-pay | Admitting: Student in an Organized Health Care Education/Training Program

## 2024-03-28 VITALS — BP 170/90 | HR 81 | Ht 62.5 in | Wt 136.3 lb

## 2024-03-28 DIAGNOSIS — I10 Essential (primary) hypertension: Secondary | ICD-10-CM | POA: Diagnosis not present

## 2024-03-28 DIAGNOSIS — R011 Cardiac murmur, unspecified: Secondary | ICD-10-CM | POA: Diagnosis not present

## 2024-03-28 DIAGNOSIS — Z13220 Encounter for screening for lipoid disorders: Secondary | ICD-10-CM

## 2024-03-28 MED ORDER — LOSARTAN POTASSIUM 25 MG PO TABS: 25.0000 mg | ORAL_TABLET | Freq: Every day | ORAL | 3 refills | Status: DC

## 2024-03-28 NOTE — Patient Instructions (Signed)
 Medication Instructions:  START: Losartan 25 mg (1 tablet) daily *If you need a refill on your cardiac medications before your next appointment, please call your pharmacy*  Lab Work: 1 WEEK: BMET, Lipid, LPa If you have labs (blood work) drawn today and your tests are completely normal, you will receive your results only by: MyChart Message (if you have MyChart) OR A paper copy in the mail If you have any lab test that is abnormal or we need to change your treatment, we will call you to review the results.  Testing/Procedures: Your physician has requested that you have an echocardiogram. Echocardiography is a painless test that uses sound waves to create images of your heart. It provides your doctor with information about the size and shape of your heart and how well your heart's chambers and valves are working. This procedure takes approximately one hour. There are no restrictions for this procedure. Please do NOT wear cologne, perfume, aftershave, or lotions (deodorant is allowed). Please arrive 15 minutes prior to your appointment time.  Please note: We ask at that you not bring children with you during ultrasound (echo/ vascular) testing. Due to room size and safety concerns, children are not allowed in the ultrasound rooms during exams. Our front office staff cannot provide observation of children in our lobby area while testing is being conducted. An adult accompanying a patient to their appointment will only be allowed in the ultrasound room at the discretion of the ultrasound technician under special circumstances. We apologize for any inconvenience.   Follow-Up: At [facility], you and your health needs are our priority.  As part of our continuing mission to provide you with exceptional heart care, our providers are all part of one team.  This team includes your primary Cardiologist (physician) and Advanced Practice Providers or APPs (Physician Assistants and Nurse  Practitioners) who all work together to provide you with the care you need, when you need it.  Your next appointment:   1 month(s)  Provider:   Dr Floretta  We recommend signing up for the patient portal called MyChart.  Sign up information is provided on this After Visit Summary.  MyChart is used to connect with patients for Virtual Visits (Telemedicine).  Patients are able to view lab/test results, encounter notes, upcoming appointments, etc.  Non-urgent messages can be sent to your provider as well.   To learn more about what you can do with MyChart, go to [email or web address].   Other Instructions Please keep blood pressure log for 2 weeks and call/ mychart readings

## 2024-03-28 NOTE — Assessment & Plan Note (Signed)
-   Patient presenting for management of hypertension. -The patient has pretty markedly elevated blood pressures. -The only potential secondary cause that comes out through questioning is possible sleep apnea.  The patient wants to hold off on testing for sleep apnea at this time though. -I strongly suspect that the patient is going to need at least 2 blood pressure agents to better control her hypertension. -After an extensive conversation about different types of blood pressure medicines and the risk/benefits of each, we agreed upon continuing amlodipine at 5 mg daily and starting losartan at 25 mg daily. -I instructed the patient to adhere to a low-salt DASH diet and to check her blood pressure twice daily for the next 2 weeks and share with me the log. -I will have her follow-up in 1 month. Continue amlodipine 5 mg daily Start losartan 25 mg daily Check blood pressure x 2 weeks Follow-up with me in 1 month Consider sleep apnea testing at next visit Check BMP in 1 week for potassium and creatinine

## 2024-03-28 NOTE — Progress Notes (Signed)
 Cardiology Office Note:   Date:  03/28/2024  ID:  Emily Montgomery, DOB Jul 16, 1960, MRN [id number] PCP: Thedora Garnette HERO, MD  [facility] Providers Cardiologist:  Georganna Archer Chief Complaint:  Chief Complaint  Patient presents with   Hypertension      History of Present Illness:   Emily Montgomery is a 63 y.o. female with a PMH of HTN and multinodular goiter who presents as a new patient referral by Dr. Thedora for the evaluation of HTN and biatrial enlargement.  Patient was in her usual state of health until she was assessed prior to a colonoscopy and was found to have markedly elevated blood pressure.  She was asymptomatic but instructed to go to the ED for more urgent assessment.  She presented to the ED and laboratory testing was unremarkable.  EKG reportedly showed biatrial enlargement, but I reviewed the patient's ECG and there is possible left atrial enlargement but no evidence of right atrial enlargement.  She was started on amlodipine 5 mg daily and referred to cardiology for further management.  The patient has been healthy all her life and really has no symptoms.  She denies chest pain, SOB, PND, orthopnea, swelling, syncope and presyncope.  She denies tobacco, alcohol, illicit drug use.  Her family history is notable for her mother who had hypertension and AS in her sister who has hypertension.  She endorses snoring without apneic episodes.  She lives in [place] with her husband and family.  She has no other concerns.   Past Medical History:  Diagnosis Date   Family history of adverse reaction to anesthesia    sister- ponv   Hypertension    Multinodular thyroid     goiter--- per pt consulted w/ endocrinologist, told benign cyst   Thickened endometrium    and endometrial irregularity     Studies Reviewed:    EKG: No new ECG           Risk Assessment/Calculations:             Physical Exam:     VS:  BP (!) 170/90   Pulse 81   Ht 5' 2.5 (1.588  m)   Wt 136 lb 4.8 oz (61.8 kg)   LMP 06/23/2015   SpO2 97%   BMI 24.53 kg/m      Wt Readings from Last 3 Encounters:  03/28/24 136 lb 4.8 oz (61.8 kg)  03/25/24 134 lb 3.2 oz (60.9 kg)  01/31/24 135 lb (61.2 kg)     GEN: Well nourished, well developed, in no acute distress NECK: No JVD; No carotid bruits CARDIAC: RRR, II/VI systolic murmur is heard at the RUSB, LUSB, and apex, no rubs or gallops RESPIRATORY:  Clear to auscultation without rales, wheezing or rhonchi  ABDOMEN: Soft, non-tender, non-distended, normal bowel sounds EXTREMITIES:  Warm and well perfused, no edema; No deformity, 2+ radial pulses PSYCH: Normal mood and affect   Assessment & Plan Primary hypertension - Patient presenting for management of hypertension. -The patient has pretty markedly elevated blood pressures. -The only potential secondary cause that comes out through questioning is possible sleep apnea.  The patient wants to hold off on testing for sleep apnea at this time though. -I strongly suspect that the patient is going to need at least 2 blood pressure agents to better control her hypertension. -After an extensive conversation about different types of blood pressure medicines and the risk/benefits of each, we agreed upon continuing amlodipine at 5 mg daily and starting losartan at 25  mg daily. -I instructed the patient to adhere to a low-salt DASH diet and to check her blood pressure twice daily for the next 2 weeks and share with me the log. -I will have her follow-up in 1 month. Continue amlodipine 5 mg daily Start losartan 25 mg daily Check blood pressure x 2 weeks Follow-up with me in 1 month Consider sleep apnea testing at next visit Check BMP in 1 week for potassium and creatinine Heart murmur - Incidentally noted to have heart murmur on exam. - Unclear if this is a benign flow murmur versus something else. - Will get an echocardiogram to assess. Complete echocardiogram Screening for  hyperlipidemia Lipid panel Lipoprotein a          This note was written with the assistance of a dictation microphone or AI dictation software. Please excuse any typos or grammatical errors.   Signed, Georganna Archer, MD 03/28/2024 3:29 PM    [facility]

## 2024-04-01 ENCOUNTER — Telehealth: Payer: Self-pay | Admitting: Student in an Organized Health Care Education/Training Program

## 2024-04-01 MED ORDER — LOSARTAN POTASSIUM 50 MG PO TABS
50.0000 mg | ORAL_TABLET | Freq: Every day | ORAL | 3 refills | Status: AC
Start: 2024-04-01 — End: ?

## 2024-04-01 NOTE — Telephone Encounter (Signed)
 Patient states she saw Dr. Floretta on Friday 03/28/24. He recommended she start on 2 blood pressure medications, amlodipine 5 mg and losartan 25 mg daily. She states he wanted to start her on higher doses but she was reluctant at the time. She states she has been tracking her blood pressure readings and is willing to go ahead and do a higher dose as they are still high at times, though she has been dealing with a death in the family and states she may be a little more stressed than normal. She reports the following pressures:  03/31/24: 140/85 161/86 138/80  03/30/24:  124/78 140/82  Patient states if Dr. Floretta wants to increase her medication dose now that is fine with her, she also plans to complete the BMET he ordered on Friday.

## 2024-04-01 NOTE — Telephone Encounter (Signed)
 Prescription for Losartan 50 mg send to pharmacy.

## 2024-04-01 NOTE — Addendum Note (Signed)
 Addended by: MANDA BOTTCHER B on: 04/01/2024 01:39 PM   Modules accepted: Orders

## 2024-04-01 NOTE — Telephone Encounter (Signed)
 Do you want her to start the extra dose tonight?

## 2024-04-01 NOTE — Telephone Encounter (Signed)
 Pt c/o medication issue:  1. Name of Medication:     2. How are you currently taking this medication (dosage and times per day)?    3. Are you having a reaction (difficulty breathing--STAT)? no  4. What is your medication issue? Patient calling in to say that she would like to go ahead and go with the dr recommendation for he mediation. Please advise

## 2024-04-08 DIAGNOSIS — I10 Essential (primary) hypertension: Secondary | ICD-10-CM | POA: Diagnosis not present

## 2024-04-08 DIAGNOSIS — R011 Cardiac murmur, unspecified: Secondary | ICD-10-CM | POA: Diagnosis not present

## 2024-04-09 ENCOUNTER — Telehealth: Payer: Self-pay | Admitting: Student in an Organized Health Care Education/Training Program

## 2024-04-09 DIAGNOSIS — K648 Other hemorrhoids: Secondary | ICD-10-CM | POA: Diagnosis not present

## 2024-04-09 DIAGNOSIS — K635 Polyp of colon: Secondary | ICD-10-CM | POA: Diagnosis not present

## 2024-04-09 DIAGNOSIS — K5730 Diverticulosis of large intestine without perforation or abscess without bleeding: Secondary | ICD-10-CM | POA: Diagnosis not present

## 2024-04-09 DIAGNOSIS — Z1211 Encounter for screening for malignant neoplasm of colon: Secondary | ICD-10-CM | POA: Diagnosis not present

## 2024-04-09 LAB — HM COLONOSCOPY

## 2024-04-09 NOTE — Telephone Encounter (Signed)
 Pt c/o medication issue:  1. Name of Medication: losartan  (COZAAR ) 50 MG tablet   2. How are you currently taking this medication (dosage and times per day)? As written   3. Are you having a reaction (difficulty breathing--STAT)? No   4. What is your medication issue? Pt called in stating she started taking 2 tablets as instructed until the 25mg  run out. She states the pharmacy did receive 50mg  tablets but she can't pick up until the 11/30 and she said she will run out of 25 mg tablets by Monday 11/24. Please advise.

## 2024-04-10 LAB — BASIC METABOLIC PANEL WITH GFR
BUN/Creatinine Ratio: 25 (ref 12–28)
BUN: 17 mg/dL (ref 8–27)
CO2: 24 mmol/L (ref 20–29)
Calcium: 9.4 mg/dL (ref 8.7–10.3)
Chloride: 106 mmol/L (ref 96–106)
Creatinine, Ser: 0.68 mg/dL (ref 0.57–1.00)
Glucose: 97 mg/dL (ref 70–99)
Potassium: 4.3 mmol/L (ref 3.5–5.2)
Sodium: 145 mmol/L — ABNORMAL HIGH (ref 134–144)
eGFR: 98 mL/min/1.73 (ref >59)

## 2024-04-10 LAB — LIPID PANEL
Chol/HDL Ratio: 3 ratio (ref 0.0–4.4)
Cholesterol, Total: 142 mg/dL (ref 100–199)
HDL: 48 mg/dL (ref >39)
LDL Chol Calc (NIH): 81 mg/dL (ref 0–99)
Triglycerides: 63 mg/dL (ref 0–149)
VLDL Cholesterol Cal: 13 mg/dL (ref 5–40)

## 2024-04-10 LAB — LIPOPROTEIN A (LPA): Lipoprotein (a): <8.4 nmol/L (ref <75.0)

## 2024-04-10 NOTE — Telephone Encounter (Signed)
 Spoke with patient regarding medication. Prescription sent to [facility] and pt stated she could pick it up today. Advised pt if there were any issues to call us  back so we could address.

## 2024-04-11 ENCOUNTER — Ambulatory Visit: Payer: Self-pay | Admitting: Student in an Organized Health Care Education/Training Program

## 2024-04-25 ENCOUNTER — Ambulatory Visit (HOSPITAL_COMMUNITY)
Admission: RE | Admit: 2024-04-25 | Discharge: 2024-04-25 | Disposition: A | Source: Ambulatory Visit | Attending: Student in an Organized Health Care Education/Training Program

## 2024-04-25 DIAGNOSIS — R011 Cardiac murmur, unspecified: Secondary | ICD-10-CM

## 2024-04-25 LAB — ECHOCARDIOGRAM COMPLETE
AR max vel: 1.43 cm2
AV Area VTI: 1.4 cm2
AV Area mean vel: 1.38 cm2
AV Mean grad: 4 mmHg
AV Peak grad: 7.5 mmHg
Ao pk vel: 1.37 m/s
Area-P 1/2: 4.63 cm2
MV M vel: 1.16 m/s
MV Peak grad: 5.4 mmHg

## 2024-04-28 NOTE — Progress Notes (Signed)
 Cardiology Office Note:   Date:  04/28/2024  ID:  Emily Montgomery, DOB 1960-07-02, MRN [id number] PCP: Thedora Garnette HERO, MD  [facility] Providers Cardiologist:  Georganna Archer Chief Complaint:  No chief complaint on file.     History of Present Illness:   Emily Montgomery is a 63 y.o. female with a PMH of HTN and multinodular goiter who presents for follow-up.  Patient was in her usual state of health until she was assessed prior to a colonoscopy and was found to have markedly elevated blood pressure.  She was asymptomatic but instructed to go to the ED for more urgent assessment.  She presented to the ED and laboratory testing was unremarkable.  EKG reportedly showed biatrial enlargement, but I reviewed the patient's ECG and there is possible left atrial enlargement but no evidence of right atrial enlargement.  She was started on amlodipine  5 mg daily and referred to cardiology for further management.  The patient has been healthy all her life and really has no symptoms.  She denies chest pain, SOB, PND, orthopnea, swelling, syncope and presyncope.  She denies tobacco, alcohol, illicit drug use.  Her family history is notable for her mother who had hypertension and AS in her sister who has hypertension.  She endorses snoring without apneic episodes.  She lives in [place] with her husband and family.  She has no other concerns.   Past Medical History:  Diagnosis Date   Family history of adverse reaction to anesthesia    sister- ponv   Hypertension    Multinodular thyroid     goiter--- per pt consulted w/ endocrinologist, told benign cyst   Thickened endometrium    and endometrial irregularity     Studies Reviewed:    EKG: No new ECG       Cardiac Studies & Procedures   ______________________________________________________________________________________________     ECHOCARDIOGRAM  ECHOCARDIOGRAM COMPLETE 04/25/2024  Narrative ECHOCARDIOGRAM  REPORT    Patient Name:   Emily Montgomery Date of Exam: 04/25/2024 Medical Rec #:  [id number]        Height:       62.5 in Accession #:    [id number]       Weight:       136.3 lb Date of Birth:  1961/01/13        BSA:          1.634 m Patient Age:    63 years         BP:           137/82 mmHg Patient Gender: F                HR:           65 bpm. Exam Location:  [address]  Procedure: 2D Echo, Cardiac Doppler and Color Doppler (Both Spectral and Color Flow Doppler were utilized during procedure).  Indications:    Heart murmur [R01.1 (ICD-10-CM)]  History:        Patient has no prior history of Echocardiogram examinations. Risk Factors:Hypertension and Non-Smoker.  Sonographer:    Rosaline Fujisawa MHA, RDMS, RVT, RDCS Referring Phys: [id number] GEORGANNA ARCHER  IMPRESSIONS   1. Left ventricular ejection fraction, by estimation, is 55 to 60%. The left ventricle has normal function. The left ventricle has no regional wall motion abnormalities. Left ventricular diastolic parameters were normal. 2. Right ventricular systolic function is normal. The right ventricular size is normal. There is normal pulmonary artery systolic pressure. 3. Right atrial size was  mildly dilated. 4. The mitral valve is normal in structure. No evidence of mitral valve regurgitation. No evidence of mitral stenosis. 5. Increased flow in the anterior LVOT prior to the aortic valve (images 5 and 6). Cannot rule out a very small subaortic membrane. The aortic valve is normal in structure. Aortic valve regurgitation is not visualized. No aortic stenosis is present. 6. The inferior vena cava is dilated in size with >50% respiratory variability, suggesting right atrial pressure of 8 mmHg.  Comparison(s): No prior Echocardiogram.  Conclusion(s)/Recommendation(s): Consider a TEE for further characterization if clinically indicated.  FINDINGS Left Ventricle: Left ventricular ejection fraction, by estimation, is 55  to 60%. The left ventricle has normal function. The left ventricle has no regional wall motion abnormalities. The left ventricular internal cavity size was normal in size. There is no left ventricular hypertrophy. Left ventricular diastolic parameters were normal.  Right Ventricle: The right ventricular size is normal. No increase in right ventricular wall thickness. Right ventricular systolic function is normal. There is normal pulmonary artery systolic pressure. The tricuspid regurgitant velocity is 1.47 m/s, and with an assumed right atrial pressure of 8 mmHg, the estimated right ventricular systolic pressure is 16.6 mmHg.  Left Atrium: Left atrial size was normal in size.  Right Atrium: Right atrial size was mildly dilated.  Pericardium: There is no evidence of pericardial effusion.  Mitral Valve: The mitral valve is normal in structure. No evidence of mitral valve regurgitation. No evidence of mitral valve stenosis.  Tricuspid Valve: The tricuspid valve is normal in structure. Tricuspid valve regurgitation is not demonstrated. No evidence of tricuspid stenosis.  Aortic Valve: Increased flow in the anterior LVOT prior to the aortic valve (images 5 and 6). Cannot rule out a very small subaortic membrane. The aortic valve is normal in structure. Aortic valve regurgitation is not visualized. No aortic stenosis is present. Aortic valve mean gradient measures 4.0 mmHg. Aortic valve peak gradient measures 7.5 mmHg. Aortic valve area, by VTI measures 1.40 cm.  Pulmonic Valve: The pulmonic valve was normal in structure. Pulmonic valve regurgitation is not visualized. No evidence of pulmonic stenosis.  Aorta: The aortic root and ascending aorta are structurally normal, with no evidence of dilitation.  Venous: The inferior vena cava is dilated in size with greater than 50% respiratory variability, suggesting right atrial pressure of 8 mmHg.  IAS/Shunts: No atrial level shunt detected by color  flow Doppler.   LEFT VENTRICLE PLAX 2D LVIDd:         3.62 cm   Diastology LV PW:         1.01 cm   LV e' medial:    6.31 cm/s LV IVS:        1.08 cm   LV E/e' medial:  18.4 LVOT diam:     1.54 cm   LV e' lateral:   7.83 cm/s LV SV:         44        LV E/e' lateral: 14.8 LV SV Index:   27 LVOT Area:     1.86 cm   RIGHT VENTRICLE RV Basal diam:  3.01 cm RV Mid diam:    2.38 cm RV S prime:     12.70 cm/s TAPSE (M-mode): 2.2 cm  LEFT ATRIUM             Index        RIGHT ATRIUM           Index LA Vol (A2C):  24.6 ml 15.06 ml/m  RA Area:     17.70 cm LA Vol (A4C):   49.7 ml 30.42 ml/m  RA Volume:   47.20 ml  28.89 ml/m LA Biplane Vol: 36.1 ml 22.09 ml/m AORTIC VALVE AV Area (Vmax):    1.43 cm AV Area (Vmean):   1.38 cm AV Area (VTI):     1.40 cm AV Vmax:           137.00 cm/s AV Vmean:          93.100 cm/s AV VTI:            0.316 m AV Peak Grad:      7.5 mmHg AV Mean Grad:      4.0 mmHg LVOT Vmax:         105.00 cm/s LVOT Vmean:        69.000 cm/s LVOT VTI:          0.237 m LVOT/AV VTI ratio: 0.75  AORTA Ao Root diam: 2.96 cm Ao Asc diam:  3.46 cm  MITRAL VALVE                TRICUSPID VALVE MV Area (PHT): 4.63 cm     TR Peak grad:   8.6 mmHg MV Decel Time: 164 msec     TR Vmax:        147.00 cm/s MR Peak grad: 5.4 mmHg MR Vmax:      116.00 cm/s   SHUNTS MV E velocity: 116.00 cm/s  Systemic VTI:  0.24 m MV A velocity: 93.40 cm/s   Systemic Diam: 1.54 cm MV E/A ratio:  1.24  Emeline Calender Electronically signed by Emeline Calender Signature Date/Time: 04/25/2024/4:11:14 PM    Final          ______________________________________________________________________________________________      Risk Assessment/Calculations:             Physical Exam:     VS:  LMP 06/23/2015      Wt Readings from Last 3 Encounters:  03/28/24 136 lb 4.8 oz (61.8 kg)  03/25/24 134 lb 3.2 oz (60.9 kg)  01/31/24 135 lb (61.2 kg)     GEN: Well nourished, well  developed, in no acute distress NECK: No JVD; No carotid bruits CARDIAC: RRR, II/VI systolic murmur is heard at the RUSB, LUSB, and apex, no rubs or gallops RESPIRATORY:  Clear to auscultation without rales, wheezing or rhonchi  ABDOMEN: Soft, non-tender, non-distended, normal bowel sounds EXTREMITIES:  Warm and well perfused, no edema; No deformity, 2+ radial pulses PSYCH: Normal mood and affect   Assessment & Plan           This note was written with the assistance of a dictation microphone or AI dictation software. Please excuse any typos or grammatical errors.   Signed, Georganna Archer, MD 04/28/2024 10:37 PM    [facility]

## 2024-04-29 ENCOUNTER — Encounter: Payer: Self-pay | Admitting: Student in an Organized Health Care Education/Training Program

## 2024-04-29 ENCOUNTER — Ambulatory Visit (INDEPENDENT_AMBULATORY_CARE_PROVIDER_SITE_OTHER): Admitting: Family Medicine

## 2024-04-29 ENCOUNTER — Ambulatory Visit
Attending: Student in an Organized Health Care Education/Training Program | Admitting: Student in an Organized Health Care Education/Training Program

## 2024-04-29 VITALS — BP 136/82 | HR 65 | Resp 16 | Ht 62.0 in | Wt 131.8 lb

## 2024-04-29 VITALS — BP 116/70 | HR 67 | Temp 97.7°F | Ht 62.0 in

## 2024-04-29 DIAGNOSIS — I10 Essential (primary) hypertension: Secondary | ICD-10-CM | POA: Diagnosis not present

## 2024-04-29 DIAGNOSIS — R011 Cardiac murmur, unspecified: Secondary | ICD-10-CM | POA: Insufficient documentation

## 2024-04-29 NOTE — Assessment & Plan Note (Signed)
-   BPs are currently at goal. -The patient was to continue her BP management with her PCP which is completely reasonable. -The patient can return to cardiology on an as-needed basis. -It was a pleasure being part of Emily Montgomery's care! Goal BP <130/80 Continue amlodipine  5 mg daily Continue losartan  50 mg daily Follow-up as needed

## 2024-04-29 NOTE — Assessment & Plan Note (Signed)
 Blood pressure is at goal today. Continue amlodipine  5 mg daily and losartan  50 mg daily. Continue efforts at lifestyle changes, including regular exercise and sodium reduction. Recent weight loss will also have helped. I will see her back in 3 months. If BP still doing well, may try and stop the amlodipine .

## 2024-04-29 NOTE — Assessment & Plan Note (Signed)
-   She has an audible systolic heart murmur within normal recent echocardiogram.  This likely represents a benign flow murmur.  No additional testing at this time.

## 2024-04-29 NOTE — Patient Instructions (Signed)
 Medication Instructions:  - No changes  *If you need a refill on your cardiac medications before your next appointment, please call your pharmacy*  Lab Work: - None ordered  Testing/Procedures: - None ordered  Follow-Up: At [facility], you and your health needs are our priority.  As part of our continuing mission to provide you with exceptional heart care, our providers are all part of one team.  This team includes your primary Cardiologist (physician) and Advanced Practice Providers or APPs (Physician Assistants and Nurse Practitioners) who all work together to provide you with the care you need, when you need it.  Your next appointment:   Follow-up as needed with Dr. Floretta.  We recommend signing up for the patient portal called MyChart.  Sign up information is provided on this After Visit Summary.  MyChart is used to connect with patients for Virtual Visits (Telemedicine).  Patients are able to view lab/test results, encounter notes, upcoming appointments, etc.  Non-urgent messages can be sent to your provider as well.   To learn more about what you can do with MyChart, go to [email or web address].

## 2024-04-29 NOTE — Progress Notes (Signed)
 [facility] [address] [place] [address] Dept: [phone] Dept Fax: [phone]  Chronic Care Office Visit  Subjective:    Patient ID: Emily Montgomery, female    DOB: 17-Dec-1960, 63 y.o..   MRN: [id number]  Chief Complaint  Patient presents with   Hypertension    4 week f/u.    History of Present Illness:  Patient is in today for reassessment of chronic medical conditions.   Ms. Petrakis was recently diagnosed with hypertension after her blood pressure was noted to be in the 180s in-office and over 200/100 in the ED, however she was asymptomatic with these significant elevated pressures. She is currently managed with amlodipine  5 mg daily and losartan  509 mg daily. She has been seen by cardiology due to concerns abotu a heart murmur and evidence of biatrial enlargement on her EKG. However, she was told her echocardiogram looked normal.  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   Annual physical exam 01/31/2024   Multinodular goiter 09/25/2014   Past Surgical History:  Procedure Laterality Date   BLADDER SUSPENSION N/A 02/12/2017   Procedure: TRANSVAGINAL TAPE (TVT) PROCEDURE;  Surgeon: Cathlyn JAYSON Nikki Bobie FORBES, MD;  Location: [facility];  Service: Gynecology;  Laterality: N/A;   COLONOSCOPY WITH PROPOFOL   2015   CYSTOSCOPY N/A 02/12/2017   Procedure: CYSTOSCOPY;  Surgeon: Cathlyn JAYSON Nikki Bobie FORBES, MD;  Location: [facility];  Service: Gynecology;  Laterality: N/A;   DILATATION & CURETTAGE/HYSTEROSCOPY WITH MYOSURE N/A 09/29/2016   Procedure: DILATATION & CURETTAGE/HYSTEROSCOPY, VULVA BIOPSIES;  Surgeon: Cleotilde Ronal RAMAN, MD;  Location: [facility];  Service: Gynecology;  Laterality: N/A;   LAPAROSCOPIC BILATERAL SALPINGO OOPHERECTOMY  Bilateral 02/12/2017   Procedure: LAPAROSCOPIC BILATERAL SALPINGO OOPHORECTOMY;  Surgeon: Cleotilde Ronal RAMAN, MD;  Location: [facility];  Service: Gynecology;  Laterality: Bilateral;  possible   LAPAROSCOPIC HYSTERECTOMY N/A 02/12/2017   Procedure: HYSTERECTOMY TOTAL LAPAROSCOPIC;  Surgeon: Cleotilde Ronal RAMAN, MD;  Location: [facility];  Service: Gynecology;  Laterality: N/A;   PUBOVAGINAL SLING N/A 02/12/2017   Procedure: mid-uretheral SLING;  Surgeon: Cathlyn JAYSON Nikki Bobie FORBES, MD;  Location: [facility];  Service: Gynecology;  Laterality: N/A;   TONSILLECTOMY AND ADENOIDECTOMY  child   TOTAL ABDOMINAL HYSTERECTOMY     WISDOM TOOTH EXTRACTION  20 approx.   Family History  Problem Relation Age of Onset   Hyperlipidemia Mother    Hypertension Mother    Lung disease Father    Cancer Sister        Hodgkin's lymphoma   Stroke Maternal Aunt    Thyroid  cancer Paternal Grandmother    Thyroid  disease Neg Hx    Outpatient Medications Prior to Visit  Medication Sig Dispense Refill   amLODipine  (NORVASC ) 5 MG tablet Take 1 tablet (5 mg total) by mouth daily. 90 tablet 3   losartan  (COZAAR ) 50 MG tablet Take 1 tablet (50 mg total) by mouth daily. 90 tablet 3   Omega-3 Fatty Acids (FISH OIL) 1000 MG CAPS Take 1 capsule by mouth daily.     No facility-administered medications prior to visit.   No Known Allergies   Objective:   Today's Vitals   04/29/24 1535  BP: 116/70  Pulse: 67  Temp: 97.7 F (36.5 C)  TempSrc: Temporal  SpO2: 100%  Height: 5' 2 (1.575  m)   Body mass index is 24.11 kg/m.   General: Well developed, well nourished. No acute distress. Psych: Alert and oriented. Normal mood and affect.  Health Maintenance Due  Topic Date Due   HIV Screening  Never done   Hepatitis C Screening  Never done   Pneumococcal Vaccine: 50+ Years (1 of 1 - PCV) Never done   Zoster Vaccines- Shingrix (1 of 2) Never done   COVID-19 Vaccine (1 - 2025-26 season) Never done   Imaging ECHOCARDIOGRAM  COMPLETE Result Date: 04/25/2024 IMPRESSIONS   1. Left ventricular ejection fraction, by estimation, is 55 to 60%. The left ventricle has normal function. The left ventricle has no regional wall motion abnormalities. Left ventricular diastolic parameters were normal.   2. Right ventricular systolic function is normal. The right ventricular size is normal. There is normal pulmonary artery systolic pressure.   3. Right atrial size was mildly dilated.   4. The mitral valve is normal in structure. No evidence of mitral valve regurgitation. No evidence of mitral stenosis.   5. Increased flow in the anterior LVOT prior to the aortic valve (images 5 and 6). Cannot rule out a very small subaortic membrane. The aortic valve is normal in structure. Aortic valve regurgitation is not visualized. No aortic stenosis is present.   6. The inferior vena cava is dilated in size with >50% respiratory variability, suggesting right atrial pressure of 8 mmHg.   Lab Results    Latest Ref Rng & Units 04/08/2024   12:58 PM 03/20/2024   11:41 AM 06/17/2018    4:38 PM  CMP  Glucose 70 - 99 mg/dL 97  880  89   BUN 8 - 27 mg/dL 17  15  12    Creatinine 0.57 - 1.00 mg/dL 9.31  9.36  9.37   Sodium 134 - 144 mmol/L 145  140  142   Potassium 3.5 - 5.2 mmol/L 4.3  3.6  4.3   Chloride 96 - 106 mmol/L 106  104  102   CO2 20 - 29 mmol/L 24  24  26    Calcium 8.7 - 10.3 mg/dL 9.4  9.4  9.4   Total Protein 6.0 - 8.5 g/dL   6.8   Total Bilirubin 0.0 - 1.2 mg/dL   0.3   Alkaline Phos 39 - 117 IU/L   85   AST 0 - 40 IU/L   12   ALT 0 - 32 IU/L   17    Lab Results  Component Value Date   CHOL 142 04/08/2024   HDL 48 04/08/2024   LDLCALC 81 04/08/2024   TRIG 63 04/08/2024   CHOLHDL 3.0 04/08/2024   Component Ref Range & Units (hover) 3 wk ago  Lipoprotein (a) <8.4   Lab Results  Component Value Date   TSH 0.61 03/25/2024     Assessment & Plan:   Problem List Items Addressed This Visit       Cardiovascular and  Mediastinum   Essential hypertension - Primary   Blood pressure is at goal today. Continue amlodipine  5 mg daily and losartan  50 mg daily. Continue efforts at lifestyle changes, including regular exercise and sodium reduction. Recent weight loss will also have helped. I will see her back in 3 months. If BP still doing well, may try and stop the amlodipine .       Return in about 3 months (around 07/28/2024).   Garnette CHRISTELLA Simpler, MD  I,Emily Lagle,acting as a scribe for Garnette CHRISTELLA Simpler,  MD.,have documented all relevant documentation on the behalf of Garnette CHRISTELLA Simpler, MD.  I, Garnette CHRISTELLA Simpler, MD, have reviewed all documentation for this visit. The documentation on 04/29/2024 for the exam, diagnosis, procedures, and orders are all accurate and complete.

## 2024-05-07 DIAGNOSIS — H40023 Open angle with borderline findings, high risk, bilateral: Secondary | ICD-10-CM | POA: Diagnosis not present

## 2024-05-07 DIAGNOSIS — Z83511 Family history of glaucoma: Secondary | ICD-10-CM | POA: Diagnosis not present

## 2024-05-07 DIAGNOSIS — H43812 Vitreous degeneration, left eye: Secondary | ICD-10-CM | POA: Diagnosis not present

## 2024-07-31 ENCOUNTER — Ambulatory Visit: Admitting: Family Medicine

## 2025-02-02 ENCOUNTER — Encounter: Admitting: Family Medicine
